# Patient Record
Sex: Male | Born: 1955
Health system: Southern US, Community
[De-identification: ages and names within clinical notes are randomized; demographics above are authoritative.]

## PROBLEM LIST (undated history)

## (undated) DIAGNOSIS — E785 Hyperlipidemia, unspecified: Secondary | ICD-10-CM

## (undated) DIAGNOSIS — R7303 Prediabetes: Secondary | ICD-10-CM

## (undated) DIAGNOSIS — I10 Essential (primary) hypertension: Secondary | ICD-10-CM

## (undated) HISTORY — DX: Prediabetes: R73.03

## (undated) HISTORY — PX: NO PAST SURGERIES: SHX2092

## (undated) HISTORY — DX: Essential (primary) hypertension: I10

## (undated) HISTORY — DX: Hyperlipidemia, unspecified: E78.5

---

## 2005-06-14 ENCOUNTER — Emergency Department (HOSPITAL_COMMUNITY): Admission: EM | Admit: 2005-06-14 | Discharge: 2005-06-14 | Payer: Self-pay | Admitting: Emergency Medicine

## 2010-11-07 ENCOUNTER — Emergency Department (HOSPITAL_COMMUNITY)
Admission: EM | Admit: 2010-11-07 | Discharge: 2010-11-07 | Disposition: A | Discharge status: Home / Self Care | Payer: Self-pay | Attending: Emergency Medicine | Admitting: Emergency Medicine

## 2010-11-07 DIAGNOSIS — R030 Elevated blood-pressure reading, without diagnosis of hypertension: Secondary | ICD-10-CM | POA: Insufficient documentation

## 2010-11-07 DIAGNOSIS — R109 Unspecified abdominal pain: Secondary | ICD-10-CM | POA: Insufficient documentation

## 2012-10-09 ENCOUNTER — Encounter (HOSPITAL_COMMUNITY): Payer: Self-pay | Admitting: Emergency Medicine

## 2012-10-09 ENCOUNTER — Emergency Department (HOSPITAL_COMMUNITY): Payer: Self-pay

## 2012-10-09 ENCOUNTER — Emergency Department (HOSPITAL_COMMUNITY)
Admission: EM | Admit: 2012-10-09 | Discharge: 2012-10-09 | Disposition: A | Discharge status: Home / Self Care | Payer: Self-pay | Attending: Emergency Medicine | Admitting: Emergency Medicine

## 2012-10-09 DIAGNOSIS — R509 Fever, unspecified: Secondary | ICD-10-CM | POA: Insufficient documentation

## 2012-10-09 DIAGNOSIS — R079 Chest pain, unspecified: Secondary | ICD-10-CM | POA: Insufficient documentation

## 2012-10-09 DIAGNOSIS — R109 Unspecified abdominal pain: Secondary | ICD-10-CM | POA: Insufficient documentation

## 2012-10-09 LAB — CBC WITH DIFFERENTIAL/PLATELET
Basophils Absolute: 0.1 10*3/uL (ref 0.0–0.1)
Basophils Relative: 1 % (ref 0–1)
Eosinophils Absolute: 0.1 10*3/uL (ref 0.0–0.7)
Hemoglobin: 13.2 g/dL (ref 13.0–17.0)
Lymphocytes Relative: 25 % (ref 12–46)
MCH: 21.7 pg — ABNORMAL LOW (ref 26.0–34.0)
MCHC: 33.8 g/dL (ref 30.0–36.0)
Monocytes Absolute: 0.4 10*3/uL (ref 0.1–1.0)
Neutrophils Relative %: 68 % (ref 43–77)
Platelets: 236 10*3/uL (ref 150–400)
RDW: 15.9 % — ABNORMAL HIGH (ref 11.5–15.5)

## 2012-10-09 LAB — URINALYSIS, ROUTINE W REFLEX MICROSCOPIC
Bilirubin Urine: NEGATIVE
Glucose, UA: NEGATIVE mg/dL
Ketones, ur: NEGATIVE mg/dL
Nitrite: NEGATIVE
Specific Gravity, Urine: 1.017 (ref 1.005–1.030)
pH: 6 (ref 5.0–8.0)

## 2012-10-09 LAB — COMPREHENSIVE METABOLIC PANEL
ALT: 20 U/L (ref 0–53)
AST: 18 U/L (ref 0–37)
Albumin: 4.2 g/dL (ref 3.5–5.2)
Alkaline Phosphatase: 89 U/L (ref 39–117)
Calcium: 9.3 mg/dL (ref 8.4–10.5)
GFR calc Af Amer: 63 mL/min — ABNORMAL LOW (ref >90)
Potassium: 4.1 mEq/L (ref 3.5–5.1)
Sodium: 136 mEq/L (ref 135–145)
Total Protein: 7.6 g/dL (ref 6.0–8.3)

## 2012-10-09 LAB — TROPONIN I: Troponin I: <0.3 ng/mL (ref <0.30)

## 2012-10-09 MED ORDER — HYDROMORPHONE HCL PF 1 MG/ML IJ SOLN
1.0000 mg | Freq: Once | INTRAMUSCULAR | Status: AC
  Administered 2012-10-09: 1 mg via INTRAVENOUS
  Filled 2012-10-09: qty 1

## 2012-10-09 MED ORDER — TRAMADOL HCL 50 MG PO TABS: 50.0000 mg | ORAL_TABLET | Freq: Four times a day (QID) | ORAL | Status: DC | PRN

## 2012-10-09 MED ORDER — SODIUM CHLORIDE 0.9 % IV BOLUS (SEPSIS)
250.0000 mL | Freq: Once | INTRAVENOUS | Status: AC
  Administered 2012-10-09: 250 mL via INTRAVENOUS

## 2012-10-09 MED ORDER — ONDANSETRON HCL 4 MG/2ML IJ SOLN
4.0000 mg | Freq: Once | INTRAMUSCULAR | Status: AC
  Administered 2012-10-09: 4 mg via INTRAVENOUS
  Filled 2012-10-09: qty 2

## 2012-10-09 MED ORDER — IOHEXOL 300 MG/ML  SOLN
100.0000 mL | Freq: Once | INTRAMUSCULAR | Status: AC | PRN
  Administered 2012-10-09: 100 mL via INTRAVENOUS

## 2012-10-09 MED ORDER — SODIUM CHLORIDE 0.9 % IV SOLN
INTRAVENOUS | Status: DC
  Administered 2012-10-09: 16:00:00 via INTRAVENOUS

## 2012-10-09 MED ORDER — IOHEXOL 300 MG/ML  SOLN
25.0000 mL | INTRAMUSCULAR | Status: DC | PRN
  Administered 2012-10-09: 25 mL via ORAL

## 2012-10-09 NOTE — ED Provider Notes (Signed)
CSN: [id number]     Arrival date & time 10/09/12  1311 History   First MD Initiated Contact with Patient 10/09/12 1332     Chief Complaint  Patient presents with  . Abdominal Pain   (Consider location/radiation/quality/duration/timing/severity/associated sxs/prior Treatment) The history is provided by the patient and the spouse.   57 year old male 1 week history of lower quadrant abdominal pain no nausea vomiting or diarrhea no fever or chills. Appetite and normal. No dysuria has had some intermittent chest pain over the same period of time. Pain does not radiate to the back patient states her worst pain is 8/10. Achy in nature nature.  History reviewed. No pertinent past medical history. History reviewed. No pertinent past surgical history. History reviewed. No pertinent family history. History  Substance Use Topics  . Smoking status: Never Smoker   . Smokeless tobacco: Not on file  . Alcohol Use: Yes     Comment: occ    Review of Systems  Constitutional: Positive for fever.  HENT: Negative for congestion.   Eyes: Negative for redness.  Respiratory: Negative for shortness of breath.   Cardiovascular: Positive for chest pain.  Gastrointestinal: Positive for abdominal pain. Negative for nausea, vomiting and diarrhea.  Genitourinary: Negative for dysuria.  Musculoskeletal: Negative for back pain.  Skin: Negative for rash.  Neurological: Negative for headaches.  Hematological: Does not bruise/bleed easily.  Psychiatric/Behavioral: Negative for confusion.    Allergies  Review of patient's allergies indicates no known allergies.  Home Medications  No current outpatient prescriptions on file. BP 172/92  Pulse 85  Temp(Src) 98 F (36.7 C) (Oral)  Resp 18  Ht 6\' 4"  (1.93 m)  Wt 246 lb (111.585 kg)  BMI 29.96 kg/m2  SpO2 98% Physical Exam  Nursing note and vitals reviewed. Constitutional: He is oriented to person, place, and time. He appears well-developed and  well-nourished. No distress.  HENT:  Head: Normocephalic and atraumatic.  Mouth/Throat: Oropharynx is clear and moist.  Eyes: Conjunctivae and EOM are normal. Pupils are equal, round, and reactive to light.  Neck: Normal range of motion. Neck supple.  Cardiovascular: Normal rate and normal heart sounds.   No murmur heard. Pulmonary/Chest: Effort normal and breath sounds normal.  Abdominal: Soft. Bowel sounds are normal. There is no tenderness.  Musculoskeletal: Normal range of motion. He exhibits no edema.  Lymphadenopathy:    He has no cervical adenopathy.  Neurological: He is alert and oriented to person, place, and time. No cranial nerve deficit. He exhibits normal muscle tone. Coordination normal.  Skin: Skin is warm. No rash noted.    ED Course  Procedures (including critical care time) Labs Review Labs Reviewed  CBC WITH DIFFERENTIAL - Abnormal; Notable for the following:    RBC 6.07 (*)    MCV 64.3 (*)    MCH 21.7 (*)    RDW 15.9 (*)    All other components within normal limits  COMPREHENSIVE METABOLIC PANEL - Abnormal; Notable for the following:    Creatinine, Ser 1.41 (*)    GFR calc non Af Amer 55 (*)    GFR calc Af Amer 63 (*)    All other components within normal limits  URINALYSIS, ROUTINE W REFLEX MICROSCOPIC  LIPASE, BLOOD  TROPONIN I    Date: 10/09/2012  Rate: 69  Rhythm: normal sinus rhythm  QRS Axis: normal  Intervals: normal  ST/T Wave abnormalities: normal  Conduction Disutrbances:none  Narrative Interpretation:   Old EKG Reviewed: none available Results for orders placed during  the hospital encounter of 10/09/12  CBC WITH DIFFERENTIAL      Result Value Range   WBC 8.9  4.0 - 10.5 K/uL   RBC 6.07 (*) 4.22 - 5.81 MIL/uL   Hemoglobin 13.2  13.0 - 17.0 g/dL   HCT 40.9  81.1 - 91.4 %   MCV 64.3 (*) 78.0 - 100.0 fL   MCH 21.7 (*) 26.0 - 34.0 pg   MCHC 33.8  30.0 - 36.0 g/dL   RDW 78.2 (*) 95.6 - 21.3 %   Platelets 236  150 - 400 K/uL    Neutrophils Relative % 68  43 - 77 %   Lymphocytes Relative 25  12 - 46 %   Monocytes Relative 5  3 - 12 %   Eosinophils Relative 1  0 - 5 %   Basophils Relative 1  0 - 1 %   Neutro Abs 6.1  1.7 - 7.7 K/uL   Lymphs Abs 2.2  0.7 - 4.0 K/uL   Monocytes Absolute 0.4  0.1 - 1.0 K/uL   Eosinophils Absolute 0.1  0.0 - 0.7 K/uL   Basophils Absolute 0.1  0.0 - 0.1 K/uL   RBC Morphology ELLIPTOCYTES    COMPREHENSIVE METABOLIC PANEL      Result Value Range   Sodium 136  135 - 145 mEq/L   Potassium 4.1  3.5 - 5.1 mEq/L   Chloride 101  96 - 112 mEq/L   CO2 26  19 - 32 mEq/L   Glucose, Bld 90  70 - 99 mg/dL   BUN 10  6 - 23 mg/dL   Creatinine, Ser 0.86 (*) 0.50 - 1.35 mg/dL   Calcium 9.3  8.4 - 57.8 mg/dL   Total Protein 7.6  6.0 - 8.3 g/dL   Albumin 4.2  3.5 - 5.2 g/dL   AST 18  0 - 37 U/L   ALT 20  0 - 53 U/L   Alkaline Phosphatase 89  39 - 117 U/L   Total Bilirubin 0.4  0.3 - 1.2 mg/dL   GFR calc non Af Amer 55 (*) >90 mL/min   GFR calc Af Amer 63 (*) >90 mL/min  URINALYSIS, ROUTINE W REFLEX MICROSCOPIC      Result Value Range   Color, Urine YELLOW  YELLOW   APPearance CLEAR  CLEAR   Specific Gravity, Urine 1.017  1.005 - 1.030   pH 6.0  5.0 - 8.0   Glucose, UA NEGATIVE  NEGATIVE mg/dL   Hgb urine dipstick NEGATIVE  NEGATIVE   Bilirubin Urine NEGATIVE  NEGATIVE   Ketones, ur NEGATIVE  NEGATIVE mg/dL   Protein, ur NEGATIVE  NEGATIVE mg/dL   Urobilinogen, UA 1.0  0.0 - 1.0 mg/dL   Nitrite NEGATIVE  NEGATIVE   Leukocytes, UA NEGATIVE  NEGATIVE  ] Results for orders placed during the hospital encounter of 10/09/12  CBC WITH DIFFERENTIAL      Result Value Range   WBC 8.9  4.0 - 10.5 K/uL   RBC 6.07 (*) 4.22 - 5.81 MIL/uL   Hemoglobin 13.2  13.0 - 17.0 g/dL   HCT 46.9  62.9 - 52.8 %   MCV 64.3 (*) 78.0 - 100.0 fL   MCH 21.7 (*) 26.0 - 34.0 pg   MCHC 33.8  30.0 - 36.0 g/dL   RDW 41.3 (*) 24.4 - 01.0 %   Platelets 236  150 - 400 K/uL   Neutrophils Relative % 68  43 - 77 %    Lymphocytes Relative 25  12 - 46 %   Monocytes Relative 5  3 - 12 %   Eosinophils Relative 1  0 - 5 %   Basophils Relative 1  0 - 1 %   Neutro Abs 6.1  1.7 - 7.7 K/uL   Lymphs Abs 2.2  0.7 - 4.0 K/uL   Monocytes Absolute 0.4  0.1 - 1.0 K/uL   Eosinophils Absolute 0.1  0.0 - 0.7 K/uL   Basophils Absolute 0.1  0.0 - 0.1 K/uL   RBC Morphology ELLIPTOCYTES    COMPREHENSIVE METABOLIC PANEL      Result Value Range   Sodium 136  135 - 145 mEq/L   Potassium 4.1  3.5 - 5.1 mEq/L   Chloride 101  96 - 112 mEq/L   CO2 26  19 - 32 mEq/L   Glucose, Bld 90  70 - 99 mg/dL   BUN 10  6 - 23 mg/dL   Creatinine, Ser 1.61 (*) 0.50 - 1.35 mg/dL   Calcium 9.3  8.4 - 09.6 mg/dL   Total Protein 7.6  6.0 - 8.3 g/dL   Albumin 4.2  3.5 - 5.2 g/dL   AST 18  0 - 37 U/L   ALT 20  0 - 53 U/L   Alkaline Phosphatase 89  39 - 117 U/L   Total Bilirubin 0.4  0.3 - 1.2 mg/dL   GFR calc non Af Amer 55 (*) >90 mL/min   GFR calc Af Amer 63 (*) >90 mL/min  URINALYSIS, ROUTINE W REFLEX MICROSCOPIC      Result Value Range   Color, Urine YELLOW  YELLOW   APPearance CLEAR  CLEAR   Specific Gravity, Urine 1.017  1.005 - 1.030   pH 6.0  5.0 - 8.0   Glucose, UA NEGATIVE  NEGATIVE mg/dL   Hgb urine dipstick NEGATIVE  NEGATIVE   Bilirubin Urine NEGATIVE  NEGATIVE   Ketones, ur NEGATIVE  NEGATIVE mg/dL   Protein, ur NEGATIVE  NEGATIVE mg/dL   Urobilinogen, UA 1.0  0.0 - 1.0 mg/dL   Nitrite NEGATIVE  NEGATIVE   Leukocytes, UA NEGATIVE  NEGATIVE  LIPASE, BLOOD      Result Value Range   Lipase 33  11 - 59 U/L  TROPONIN I      Result Value Range   Troponin I <0.30  <0.30 ng/mL     Imaging Review Dg Chest 2 View  10/09/2012   *RADIOLOGY REPORT*  Clinical Data: Pain  CHEST - 2 VIEW  Comparison: None.  Findings: There is subtle opacity in the lateral right base.  Lungs elsewhere clear.  Heart size and pulmonary vascularity normal.  No adenopathy.  No pneumothorax.  No bone lesions.  IMPRESSION: Subtle infiltrate lateral  right base.  Lungs otherwise clear.   Original Report Authenticated By: Bretta Bang, M.D.   Ct Abdomen Pelvis W Contrast  10/09/2012   *RADIOLOGY REPORT*  Clinical Data: Lower abdominal pain for the past week.  CT ABDOMEN AND PELVIS WITH CONTRAST  Technique:  Multidetector CT imaging of the abdomen and pelvis was performed following the standard protocol during bolus administration of intravenous contrast.  Contrast: OMNIPAQUE IOHEXOL 300 MG/ML  SOLN  Comparison: No priors.  Findings:  Lung Bases: Unremarkable.  Abdomen/Pelvis:  The appearance of the liver, gallbladder, pancreas, spleen, bilateral adrenal glands and bilateral kidneys is unremarkable.  Normal appendix.  There are a few scattered colonic diverticula noted, without surrounding inflammatory changes to suggest an acute diverticulitis at this time.  No significant  volume of ascites.  No pneumoperitoneum.  No pathologic distension of small bowel.  No definite pathologic lymphadenopathy identified within the abdomen or pelvis.  Mild atherosclerosis throughout the abdominal and pelvic vasculature, without definite aneurysm or dissection.  The prostate gland and urinary bladder are unremarkable in appearance.  Musculoskeletal: There are no aggressive appearing lytic or blastic lesions noted in the visualized portions of the skeleton.  IMPRESSION: 1.  No acute findings in the abdomen or pelvis to account for patient's symptoms. 2.  Specifically, the appendix is normal. 3.  Mild colonic diverticulosis without findings to suggest acute diverticulitis at this time. 4.   Mild atherosclerosis.   Original Report Authenticated By: Trudie Reed, M.D.    MDM  No diagnosis found.   Discuss with radiology. CT cuts up into the lower part of the longest is not show any evidence of infiltrate or nodule opacity on the right lung base. There is an area of atelectasis there. Patient clinically without a leukocytosis CT of the abdomen without findings to  explain the abdominal discomfort. May give patient a trial of Pepcid no change in liver function tests urinalysis is negative. Patient's lipase is still pending along with a troponin.   The patient's lab workup without any significant findings no leukocytosis no liver function test abnormalities. As stated above the questionable opacification on the chest x-ray was cleared through the CT of the abdomen. Patient does have diverticulosis maybe that explains some of the mild discomfort that he is having patient's in no acute distress. Recommend followup with wellness clinic and possible colonoscopy. We'll treat with tramadol in the meantime. As stated patient is nontoxic and in no acute distress. Patient denied dysuria to me.  In addition patient did talk about some intermittent chest pain nothing lasting for a period of time. Has been present for 1 week as well troponins negative EKG without any acute changes. No concerns for an acute cardiac event.  Shelda Jakes, MD 10/09/12 [phone]

## 2012-10-09 NOTE — ED Notes (Signed)
Pt c/o lower abd pain x 1 week; pt sts some dysuria

## 2012-10-09 NOTE — ED Notes (Signed)
Ct aware pt is done drinking constrast.

## 2012-10-09 NOTE — ED Notes (Signed)
Patient resting. Returned from [employer]. Denies pain. Awaiting radiology results.

## 2012-10-09 NOTE — ED Notes (Signed)
Pt ambulatory to bathroom to provide urine sample

## 2012-10-09 NOTE — ED Notes (Signed)
Pt did not answer x 1 

## 2016-03-13 ENCOUNTER — Other Ambulatory Visit: Payer: Self-pay

## 2016-03-13 ENCOUNTER — Ambulatory Visit: Payer: 59 | Attending: Family Medicine | Admitting: Family Medicine

## 2016-03-13 VITALS — BP 157/100 | HR 107 | Temp 98.1°F | Resp 18 | Ht 76.0 in | Wt 261.2 lb

## 2016-03-13 DIAGNOSIS — I10 Essential (primary) hypertension: Secondary | ICD-10-CM | POA: Insufficient documentation

## 2016-03-13 DIAGNOSIS — N529 Male erectile dysfunction, unspecified: Secondary | ICD-10-CM

## 2016-03-13 DIAGNOSIS — Z0000 Encounter for general adult medical examination without abnormal findings: Secondary | ICD-10-CM

## 2016-03-13 HISTORY — DX: Essential (primary) hypertension: I10

## 2016-03-13 LAB — CBC WITH DIFFERENTIAL/PLATELET
BASOS ABS: 70 {cells}/uL (ref 0–200)
Basophils Relative: 1 %
Eosinophils Absolute: 70 cells/uL (ref 15–500)
Eosinophils Relative: 1 %
HEMATOCRIT: 41.7 % (ref 38.5–50.0)
Hemoglobin: 13.6 g/dL (ref 13.2–17.1)
LYMPHS ABS: 1470 {cells}/uL (ref 850–3900)
Lymphocytes Relative: 21 %
MCH: 21 pg — ABNORMAL LOW (ref 27.0–33.0)
MCHC: 32.6 g/dL (ref 32.0–36.0)
MCV: 64.5 fL — ABNORMAL LOW (ref 80.0–100.0)
MONO ABS: 350 {cells}/uL (ref 200–950)
Monocytes Relative: 5 %
NEUTROS PCT: 72 %
Neutro Abs: 5040 cells/uL (ref 1500–7800)
Platelets: 204 10*3/uL (ref 140–400)
RBC: 6.47 MIL/uL — AB (ref 4.20–5.80)
RDW: 17.6 % — ABNORMAL HIGH (ref 11.0–15.0)
WBC: 7 10*3/uL (ref 3.8–10.8)

## 2016-03-13 LAB — BASIC METABOLIC PANEL WITH GFR
BUN: 19 mg/dL (ref 7–25)
CHLORIDE: 105 mmol/L (ref 98–110)
CO2: 25 mmol/L (ref 20–31)
Calcium: 10.3 mg/dL (ref 8.6–10.3)
Creat: 1.6 mg/dL — ABNORMAL HIGH (ref 0.70–1.33)
GFR, Est African American: 54 mL/min — ABNORMAL LOW (ref >=60)
GFR, Est Non African American: 46 mL/min — ABNORMAL LOW (ref >=60)
GLUCOSE: 103 mg/dL — AB (ref 65–99)
POTASSIUM: 4.6 mmol/L (ref 3.5–5.3)
Sodium: 139 mmol/L (ref 135–146)

## 2016-03-13 LAB — HEPATIC FUNCTION PANEL
ALBUMIN: 4.9 g/dL (ref 3.6–5.1)
ALT: 26 U/L (ref 9–46)
AST: 24 U/L (ref 10–35)
Alkaline Phosphatase: 84 U/L (ref 40–115)
BILIRUBIN TOTAL: 0.5 mg/dL (ref 0.2–1.2)
Bilirubin, Direct: 0.1 mg/dL (ref <=0.2)
Indirect Bilirubin: 0.4 mg/dL (ref 0.2–1.2)
Total Protein: 8.1 g/dL (ref 6.1–8.1)

## 2016-03-13 LAB — LIPID PANEL
Cholesterol: 231 mg/dL — ABNORMAL HIGH (ref <200)
HDL: 44 mg/dL (ref >40)
LDL Cholesterol: 162 mg/dL — ABNORMAL HIGH (ref <100)
Total CHOL/HDL Ratio: 5.3 Ratio — ABNORMAL HIGH (ref <5.0)
Triglycerides: 127 mg/dL (ref <150)
VLDL: 25 mg/dL (ref <30)

## 2016-03-13 LAB — PSA: PSA: 1.2 ng/mL (ref <=4.0)

## 2016-03-13 LAB — TSH: TSH: 0.76 mIU/L (ref 0.40–4.50)

## 2016-03-13 MED ORDER — AMLODIPINE BESYLATE 5 MG PO TABS: 5.0000 mg | ORAL_TABLET | Freq: Every day | ORAL | 2 refills | Status: DC

## 2016-03-13 MED ORDER — CLONIDINE HCL 0.2 MG PO TABS
0.2000 mg | ORAL_TABLET | Freq: Once | ORAL | Status: AC
  Administered 2016-03-13: 0.2 mg via ORAL

## 2016-03-13 MED ORDER — HYDROCHLOROTHIAZIDE 25 MG PO TABS: 25.0000 mg | ORAL_TABLET | Freq: Every day | ORAL | 3 refills | Status: DC

## 2016-03-13 MED ORDER — TADALAFIL 20 MG PO TABS: 10.0000 mg | ORAL_TABLET | Freq: Every day | ORAL | 2 refills | Status: DC | PRN

## 2016-03-13 NOTE — Patient Instructions (Addendum)
Come back in 1 week for BP check with clinical pharmacist. Start taking BP at least 3 times a week while at rest. Bring BP log to next visit.   Hypertension Hypertension is another name for high blood pressure. High blood pressure forces your heart to work harder to pump blood. A blood pressure reading has two numbers, which includes a higher number over a lower number (example: 110/72). Follow these instructions at home:  Have your blood pressure rechecked by your doctor.  Only take medicine as told by your doctor. Follow the directions carefully. The medicine does not work as well if you skip doses. Skipping doses also puts you at risk for problems.  Do not smoke.  Monitor your blood pressure at home as told by your doctor. Contact a doctor if:  You think you are having a reaction to the medicine you are taking.  You have repeat headaches or feel dizzy.  You have puffiness (swelling) in your ankles.  You have trouble with your vision. Get help right away if:  You get a very bad headache and are confused.  You feel weak, numb, or faint.  You get chest or belly (abdominal) pain.  You throw up (vomit).  You cannot breathe very well. This information is not intended to replace advice given to you by your health care provider. Make sure you discuss any questions you have with your health care provider. Document Released: 07/10/2007 Document Revised: 06/29/2015 Document Reviewed: 11/13/2012 Elsevier Interactive Patient Education  2017 [employer]  Erectile Dysfunction Erectile dysfunction is the inability to get or sustain a good enough erection to have sexual intercourse. Erectile dysfunction may involve:  Inability to get an erection.  Lack of enough hardness to allow penetration.  Loss of the erection before sex is finished.  Premature ejaculation. CAUSES  Certain drugs, such as:  Pain relievers.  Antihistamines.  Antidepressants.  Blood pressure  medicines.  Water pills (diuretics).  Ulcer medicines.  Muscle relaxants.  Illegal drugs.  Excessive drinking.  Psychological causes, such as:  Anxiety.  Depression.  Sadness.  Exhaustion.  Performance fear.  Stress.  Physical causes, such as:  Artery problems. This may include diabetes, smoking, liver disease, or atherosclerosis.  High blood pressure.  Hormonal problems, such as low testosterone.  Obesity.  Nerve problems. This may include back or pelvic injuries, diabetes mellitus, multiple sclerosis, or Parkinson disease. SYMPTOMS  Inability to get an erection.  Lack of enough hardness to allow penetration.  Loss of the erection before sex is finished.  Premature ejaculation.  Normal erections at some times, but with frequent unsatisfactory episodes.  Orgasms that are not satisfactory in sensation or frequency.  Low sexual satisfaction in either partner because of erection problems.  A curved penis occurring with erection. The curve may cause pain or may be too curved to allow for intercourse.  Never having nighttime erections. DIAGNOSIS Your caregiver can often diagnose this condition by:  Performing a physical exam to find other diseases or specific problems with the penis.  Asking you detailed questions about the problem.  Performing blood tests to check for diabetes mellitus or to measure hormone levels.  Performing urine tests to find other underlying health conditions.  Performing an ultrasound exam to check for scarring.  Performing a test to check blood flow to the penis.  Doing a sleep study at home to measure nighttime erections. TREATMENT   You may be prescribed medicines by mouth.  You may be given medicine injections into  the penis.  You may be prescribed a vacuum pump with a ring.  Penile implant surgery may be performed. You may receive:  An inflatable implant.  A semirigid implant.  Blood vessel surgery may be  performed. HOME CARE INSTRUCTIONS  If you are prescribed oral medicine, you should take the medicine as prescribed. Do not increase the dosage without first discussing it with your physician.  If you are using self-injections, be careful to avoid any veins that are on the surface of the penis. Apply pressure to the injection site for 5 minutes.  If you are using a vacuum pump, make sure you have read the instructions before using it. Discuss any questions with your physician before taking the pump home. SEEK MEDICAL CARE IF:  You experience pain that is not responsive to the pain medicine you have been prescribed.  You experience nausea or vomiting. SEEK IMMEDIATE MEDICAL CARE IF:   When taking oral or injectable medications, you experience an erection that lasts longer than 4 hours. If your physician is unavailable, go to the nearest emergency room for evaluation. An erection that lasts much longer than 4 hours can result in permanent damage to your penis.  You have pain that is severe.  You develop redness, severe pain, or severe swelling of your penis.  You have redness spreading up into your groin or lower abdomen.  You are unable to pass your urine. This information is not intended to replace advice given to you by your health care provider. Make sure you discuss any questions you have with your health care provider. Document Released: 01/19/2000 Document Revised: 09/23/2012 Document Reviewed: 06/25/2012 Elsevier Interactive Patient Education  2017 [employer] Inc.   Tadalafil tablets (Cialis) What is this medicine? TADALAFIL (tah DA la fil) is used to treat erection problems in men. It is also used for enlargement of the prostate gland in men, a condition called benign prostatic hyperplasia or BPH. This medicine improves urine flow and reduces BPH symptoms. This medicine can also treat both erection problems and BPH when they occur together. This medicine may be used for other  purposes; ask your health care provider or pharmacist if you have questions. COMMON BRAND NAME(S): Cialis What should I tell my health care provider before I take this medicine? They need to know if you have any of these conditions: -bleeding disorders -eye or vision problems, including a rare inherited eye disease called retinitis pigmentosa -anatomical deformation of the penis, Peyronie's disease, or history of priapism (painful and prolonged erection) -heart disease, angina, a history of heart attack, irregular heart beats, or other heart problems -high or low blood pressure -history of blood diseases, like sickle cell anemia or leukemia -history of stomach bleeding -kidney disease -liver disease -stroke -an unusual or allergic reaction to tadalafil, other medicines, foods, dyes, or preservatives -pregnant or trying to get pregnant -breast-feeding How should I use this medicine? Take this medicine by mouth with a glass of water. Follow the directions on the prescription label. You may take this medicine with or without meals. When this medicine is used for erection problems, your doctor may prescribe it to be taken once daily or as needed. If you are taking the medicine as needed, you may be able to have sexual activity 30 minutes after taking it and for up to 36 hours after taking it. Whether you are taking the medicine as needed or once daily, you should not take more than one dose per day. If you are taking  this medicine for symptoms of benign prostatic hyperplasia (BPH) or to treat both BPH and an erection problem, take the dose once daily at about the same time each day. Do not take your medicine more often than directed. Talk to your pediatrician regarding the use of this medicine in children. Special care may be needed. Overdosage: If you think you have taken too much of this medicine contact a poison control center or emergency room at once. NOTE: This medicine is only for you. Do not  share this medicine with others. What if I miss a dose? If you are taking this medicine as needed for erection problems, this does not apply. If you miss a dose while taking this medicine once daily for an erection problem, benign prostatic hyperplasia, or both, take it as soon as you remember, but do not take more than one dose per day. What may interact with this medicine? Do not take this medicine with any of the following medications: -nitrates like amyl nitrite, isosorbide dinitrate, isosorbide mononitrate, nitroglycerin -other medicines for erectile dysfunction like avanafil, sildenafil, vardenafil -other tadalafil products (Adcirca) -riociguat This medicine may also interact with the following medications: -certain drugs for high blood pressure -certain drugs for the treatment of HIV infection or AIDS -certain drugs used for fungal or yeast infections, like fluconazole, itraconazole, ketoconazole, and voriconazole -certain drugs used for seizures like carbamazepine, phenytoin, and phenobarbital -grapefruit juice -macrolide antibiotics like clarithromycin, erythromycin, troleandomycin -medicines for prostate problems -rifabutin, rifampin or rifapentine This list may not describe all possible interactions. Give your health care provider a list of all the medicines, herbs, non-prescription drugs, or dietary supplements you use. Also tell them if you smoke, drink alcohol, or use illegal drugs. Some items may interact with your medicine. What should I watch for while using this medicine? If you notice any changes in your vision while taking this drug, call your doctor or health care professional as soon as possible. Stop using this medicine and call your health care provider right away if you have a loss of sight in one or both eyes. Contact your doctor or health care professional right away if the erection lasts longer than 4 hours or if it becomes painful. This may be a sign of serious  problem and must be treated right away to prevent permanent damage. If you experience symptoms of nausea, dizziness, chest pain or arm pain upon initiation of sexual activity after taking this medicine, you should refrain from further activity and call your doctor or health care professional as soon as possible. Do not drink alcohol to excess (examples, 5 glasses of wine or 5 shots of whiskey) when taking this medicine. When taken in excess, alcohol can increase your chances of getting a headache or getting dizzy, increasing your heart rate or lowering your blood pressure. Using this medicine does not protect you or your partner against HIV infection (the virus that causes AIDS) or other sexually transmitted diseases. What side effects may I notice from receiving this medicine? Side effects that you should report to your doctor or health care professional as soon as possible: -allergic reactions like skin rash, itching or hives, swelling of the face, lips, or tongue -breathing problems -changes in hearing -changes in vision -chest pain -fast, irregular heartbeat -prolonged or painful erection -seizures Side effects that usually do not require medical attention (report to your doctor or health care professional if they continue or are bothersome): -back pain -dizziness -flushing -headache -indigestion -muscle aches -nausea -stuffy or  runny nose This list may not describe all possible side effects. Call your doctor for medical advice about side effects. You may report side effects to FDA at 1-800-FDA-1088. Where should I keep my medicine? Keep out of the reach of children. Store at room temperature between 15 and 30 degrees C (59 and 86 degrees F). Throw away any unused medicine after the expiration date. NOTE: This sheet is a summary. It may not cover all possible information. If you have questions about this medicine, talk to your doctor, pharmacist, or health care provider.  2017  Elsevier/Gold Standard (2013-06-11 13:15:49)

## 2016-03-13 NOTE — Progress Notes (Signed)
Subjective:   Patient ID: Alexander Hunt, male    DOB: Mar 09, 1955, 61 y.o.   MRN: [id number]  Chief Complaint  Patient presents with  . Establish Care   HPI Alexander Hunt 61 y.o. male presents for annual physical examination. His blood pressure was found to be elevated this visit. He denies any hypertension. He denies any swelling of the bilateral lower extremities. He does report occasional chest pain with heavy lifting and occasional shortness of breath walking stairs. He denies smoking. He also has complaints of difficulty being able to get an erection. He reports history of erectile dysfunction for the last 3. He is also requesting colonoscopy referral. He denies any blood in stools, pencil thin stools, or change in bowel habits. Denies symptoms of all other pertinent systems.   Past Medical History:  Diagnosis Date  . Hypertension 03/13/2016    No past surgical history on file.  Family History  Problem Relation Age of Onset  . Diabetes Maternal Grandmother   . Hypertension Maternal Grandmother     Social History   Social History  . Marital status: Single    Spouse name: N/A  . Number of children: N/A  . Years of education: N/A   Occupational History  . Not on file.   Social History Main Topics  . Smoking status: Never Smoker  . Smokeless tobacco: Not on file  . Alcohol use Yes     Comment: occ  . Drug use: No  . Sexual activity: Not on file   Other Topics Concern  . Not on file   Social History Narrative  . No narrative on file    Outpatient Medications Prior to Visit  Medication Sig Dispense Refill  . traMADol (ULTRAM) 50 MG tablet Take 1 tablet (50 mg total) by mouth every 6 (six) hours as needed for pain. (Patient not taking: Reported on 03/13/2016) 15 tablet 0   No facility-administered medications prior to visit.     No Known Allergies  Review of Systems  Constitutional: Negative.   HENT: Negative.   Eyes: Negative.   Respiratory: Positive for  shortness of breath.   Cardiovascular: Positive for chest pain (with heavy lifting).  Gastrointestinal: Negative.   Genitourinary:       Difficulty getting an erection.   Musculoskeletal: Negative.   Skin: Negative.   Neurological: Negative.   Psychiatric/Behavioral: Negative.       Objective:    Physical Exam  Constitutional: He is oriented to person, place, and time. He appears well-developed and well-nourished.  HENT:  Head: Normocephalic and atraumatic.  Right Ear: External ear normal.  Left Ear: External ear normal.  Nose: Nose normal.  Mouth/Throat: Oropharynx is clear and moist.  Eyes: Conjunctivae and EOM are normal. Pupils are equal, round, and reactive to light.  Neck: Normal range of motion. Neck supple.  Cardiovascular: Normal rate, regular rhythm, normal heart sounds and intact distal pulses.   Pulmonary/Chest: Effort normal and breath sounds normal.  Abdominal: Soft. Bowel sounds are normal.  Musculoskeletal: Normal range of motion.  Neurological: He is alert and oriented to person, place, and time. He has normal reflexes.  Skin: Skin is warm and dry.  Psychiatric: He has a normal mood and affect. His behavior is normal. Thought content normal.  Nursing note and vitals reviewed.   BP (!) 157/100   Pulse (!) 107   Temp 98.1 F (36.7 C) (Oral)   Resp 18   Ht 6\' 4"  (1.93 m)   Wt 261 lb  3.2 oz (118.5 kg)   SpO2 96%   BMI 31.79 kg/m  Wt Readings from Last 3 Encounters:  03/13/16 261 lb 3.2 oz (118.5 kg)  10/09/12 246 lb (111.6 kg)     Lab Results  Component Value Date   TSH 0.76 03/13/2016   Lab Results  Component Value Date   WBC 7.0 03/13/2016   HGB 13.6 03/13/2016   HCT 41.7 03/13/2016   MCV 64.5 (L) 03/13/2016   PLT 204 03/13/2016   Lab Results  Component Value Date   NA 139 03/13/2016   K 4.6 03/13/2016   CO2 25 03/13/2016   GLUCOSE 103 (H) 03/13/2016   BUN 19 03/13/2016   CREATININE 1.60 (H) 03/13/2016   BILITOT 0.5 03/13/2016    ALKPHOS 84 03/13/2016   AST 24 03/13/2016   ALT 26 03/13/2016   PROT 8.1 03/13/2016   ALBUMIN 4.9 03/13/2016   ALBUMIN 4.9 03/13/2016   CALCIUM 10.3 03/13/2016   Lab Results  Component Value Date   CHOL 231 (H) 03/13/2016   Lab Results  Component Value Date   HDL 44 03/13/2016   Lab Results  Component Value Date   LDLCALC 162 (H) 03/13/2016   Lab Results  Component Value Date   TRIG 127 03/13/2016   Lab Results  Component Value Date   CHOLHDL 5.3 (H) 03/13/2016   Lab Results  Component Value Date   HGBA1C 6.0 (H) 03/13/2016       Assessment & Plan:   Problem List Items Addressed This Visit    None    Visit Diagnoses    Annual physical exam    -  Primary   Relevant Orders   BASIC METABOLIC PANEL WITH GFR (Completed)   CBC with Differential (Completed)   TSH (Completed)   Vitamin D, 25-hydroxy (Completed)   Lipid Panel (Completed)   Hepatic Function Panel (Completed)   Hemoglobin A1c (Completed)   PSA (Completed)   Essential hypertension       -Elevated BP during office. Clonidine given per protocol.    Relevant Medications   amLODipine (NORVASC) 5 MG tablet   hydrochlorothiazide (HYDRODIURIL) 25 MG tablet   tadalafil (CIALIS) 20 MG tablet   cloNIDine (CATAPRES) tablet 0.2 mg (Completed)   Other Relevant Orders   EKG 12-Lead   Erectile dysfunction, unspecified erectile dysfunction type       Relevant Medications   tadalafil (CIALIS) 20 MG tablet   Other Relevant Orders   Testosterone Total,Free,Bio, Males (Completed)   Healthcare maintenance       Relevant Orders   Ambulatory referral to Gastroenterology      Meds ordered this encounter  Medications  . amLODipine (NORVASC) 5 MG tablet    Sig: Take 1 tablet (5 mg total) by mouth daily.    Dispense:  30 tablet    Refill:  2    Order Specific Question:   Supervising Provider    Answer:   Quentin Angst [id number]  . hydrochlorothiazide (HYDRODIURIL) 25 MG tablet    Sig: Take 1 tablet (25  mg total) by mouth daily.    Dispense:  90 tablet    Refill:  3    Order Specific Question:   Supervising Provider    Answer:   Quentin Angst [id number]  . tadalafil (CIALIS) 20 MG tablet    Sig: Take 0.5-1 tablets (10-20 mg total) by mouth daily as needed for erectile dysfunction.    Dispense:  5 tablet    Refill:  2    Order Specific Question:   Supervising Provider    Answer:   Quentin AngstJEGEDE, OLUGBEMIGA E [id number]]  . cloNIDine (CATAPRES) tablet 0.2 mg    Follow up: Return in about 1 week (around 03/20/2016) for BP check with clinical pharmacist .   Arrie SenateMandesia Keylon Labelle, FNP

## 2016-03-13 NOTE — Progress Notes (Signed)
Patient is here for physical  Patient has eaten today  Patient has not taking any meds today

## 2016-03-14 LAB — TESTOSTERONE TOTAL,FREE,BIO, MALES
Albumin: 4.9 g/dL (ref 3.6–5.1)
Sex Hormone Binding: 23 nmol/L (ref 22–77)
TESTOSTERONE BIOAVAILABLE: 92.8 ng/dL — AB (ref 110.0–575.0)
TESTOSTERONE: 260 ng/dL (ref 250–827)
Testosterone, Free: 41.6 pg/mL — ABNORMAL LOW (ref 46.0–224.0)

## 2016-03-14 LAB — HEMOGLOBIN A1C
Hgb A1c MFr Bld: 6 % — ABNORMAL HIGH (ref <5.7)
Mean Plasma Glucose: 126 mg/dL

## 2016-03-14 LAB — VITAMIN D 25 HYDROXY (VIT D DEFICIENCY, FRACTURES): VIT D 25 HYDROXY: 11 ng/mL — AB (ref 30–100)

## 2016-03-16 ENCOUNTER — Encounter: Payer: Self-pay | Admitting: Family Medicine

## 2016-03-20 ENCOUNTER — Ambulatory Visit: Payer: 59 | Attending: Family Medicine | Admitting: Pharmacist

## 2016-03-20 VITALS — BP 159/90 | HR 79

## 2016-03-20 DIAGNOSIS — I10 Essential (primary) hypertension: Secondary | ICD-10-CM

## 2016-03-20 MED ORDER — AMLODIPINE BESYLATE 10 MG PO TABS: 10.0000 mg | ORAL_TABLET | Freq: Every day | ORAL | 3 refills | Status: DC

## 2016-03-20 NOTE — Progress Notes (Signed)
   S:    Patient arrives in good spirits.    Presents to the clinic for hypertension evaluation. Patient was referred on 03/13/16.  Patient was last seen by Primary Care Provider on 03/13/16.   Patient reports adherence with medications.  Current BP Medications include:  Amlodipine 5 mg daily, hydrochlorothiazide 25 mg daily.  Antihypertensives tried in the past include: none - newly diagnosed   O:   Last 3 Office BP readings: BP Readings from Last 3 Encounters:  03/13/16 (!) 157/100  10/09/12 157/87    BMET    Component Value Date/Time   NA 139 03/13/2016 1105   K 4.6 03/13/2016 1105   CL 105 03/13/2016 1105   CO2 25 03/13/2016 1105   GLUCOSE 103 (H) 03/13/2016 1105   BUN 19 03/13/2016 1105   CREATININE 1.60 (H) 03/13/2016 1105   CALCIUM 10.3 03/13/2016 1105   GFRNONAA 46 (L) 03/13/2016 1105   GFRAA 54 (L) 03/13/2016 1105    A/P: Hypertension newly diagnosed currently UNcontrolled on current medications. Continue hydrochlorothiazide 25 mg daily and increase amlodipine to 10 mg daily. Goal blood pressure is <130/80. Will plan for BMET at next visit to assess hydrochlorothiazide impact on electrolytes and to see if kidney function has improved any in case additional agents are needed.  Results reviewed and written information provided.   Total time in face-to-face counseling 20 minutes.   F/U Clinic Visit with me in 1-2 weeks.  Patient seen with Mable FillAmy Dorszynski, PharmD Candidate

## 2016-03-20 NOTE — Patient Instructions (Addendum)
Thanks for coming to see us!  Continue hydrochlorothiazide 25 mg daily.  Increase amlodipine to 10 mg daily. You can take 2 of your 5 mg tablets to use them up but I will send a prescription for the 10 mg tablets to the pharmacy  Come back in 1-2 weeks for a blood pressure check with me  DASH Eating Plan DASH stands for "Dietary Approaches to Stop Hypertension." The DASH eating plan is a healthy eating plan that has been shown to reduce high blood pressure (hypertension). Additional health benefits may include reducing the risk of type 2 diabetes mellitus, heart disease, and stroke. The DASH eating plan may also help with weight loss. What do I need to know about the DASH eating plan? For the DASH eating plan, you will follow these general guidelines:  Choose foods with less than 150 milligrams of sodium per serving (as listed on the food label).  Use salt-free seasonings or herbs instead of table salt or sea salt.  Check with your health care provider or pharmacist before using salt substitutes.  Eat lower-sodium products. These are often labeled as "low-sodium" or "no salt added."  Eat fresh foods. Avoid eating a lot of canned foods.  Eat more vegetables, fruits, and low-fat dairy products.  Choose whole grains. Look for the word "whole" as the first word in the ingredient list.  Choose fish and skinless chicken or [place] more often than red meat. Limit fish, poultry, and meat to 6 oz (170 g) each day.  Limit sweets, desserts, sugars, and sugary drinks.  Choose heart-healthy fats.  Eat more home-cooked food and less restaurant, buffet, and fast food.  Limit fried foods.  Do not fry foods. Cook foods using methods such as baking, boiling, grilling, and broiling instead.  When eating at a restaurant, ask that your food be prepared with less salt, or no salt if possible. What foods can I eat? Seek help from a dietitian for individual calorie needs. Grains  Whole grain or  whole wheat bread. Brown rice. Whole grain or whole wheat pasta. Quinoa, bulgur, and whole grain cereals. Low-sodium cereals. Corn or whole wheat flour tortillas. Whole grain cornbread. Whole grain crackers. Low-sodium crackers. Vegetables  Fresh or frozen vegetables (raw, steamed, roasted, or grilled). Low-sodium or reduced-sodium tomato and vegetable juices. Low-sodium or reduced-sodium tomato sauce and paste. Low-sodium or reduced-sodium canned vegetables. Fruits  All fresh, canned (in natural juice), or frozen fruits. Meat and Other Protein Products  Ground beef (85% or leaner), grass-fed beef, or beef trimmed of fat. Skinless chicken or [place]. Ground chicken or [place]. Pork trimmed of fat. All fish and seafood. Eggs. Dried beans, peas, or lentils. Unsalted nuts and seeds. Unsalted canned beans. Dairy  Low-fat dairy products, such as skim or 1% milk, 2% or reduced-fat cheeses, low-fat ricotta or cottage cheese, or plain low-fat yogurt. Low-sodium or reduced-sodium cheeses. Fats and Oils  Tub margarines without trans fats. Light or reduced-fat mayonnaise and salad dressings (reduced sodium). Avocado. Safflower, olive, or canola oils. Natural peanut or almond butter. Other  Unsalted popcorn and pretzels. The items listed above may not be a complete list of recommended foods or beverages. Contact your dietitian for more options.  What foods are not recommended? Grains  White bread. White pasta. White rice. Refined cornbread. Bagels and croissants. Crackers that contain trans fat. Vegetables  Creamed or fried vegetables. Vegetables in a cheese sauce. Regular canned vegetables. Regular canned tomato sauce and paste. Regular tomato and vegetable juices. Fruits  Canned fruit in light or heavy syrup. Fruit juice. Meat and Other Protein Products  Fatty cuts of meat. Ribs, chicken wings, bacon, sausage, bologna, salami, chitterlings, fatback, hot dogs, bratwurst, and packaged luncheon meats. Salted  nuts and seeds. Canned beans with salt. Dairy  Whole or 2% milk, cream, half-and-half, and cream cheese. Whole-fat or sweetened yogurt. Full-fat cheeses or blue cheese. Nondairy creamers and whipped toppings. Processed cheese, cheese spreads, or cheese curds. Condiments  Onion and garlic salt, seasoned salt, table salt, and sea salt. Canned and packaged gravies. Worcestershire sauce. Tartar sauce. Barbecue sauce. Teriyaki sauce. Soy sauce, including reduced sodium. Steak sauce. Fish sauce. Oyster sauce. Cocktail sauce. Horseradish. Ketchup and mustard. Meat flavorings and tenderizers. Bouillon cubes. Hot sauce. Tabasco sauce. Marinades. Taco seasonings. Relishes. Fats and Oils  Butter, stick margarine, lard, shortening, ghee, and bacon fat. Coconut, palm kernel, or palm oils. Regular salad dressings. Other  Pickles and olives. Salted popcorn and pretzels. The items listed above may not be a complete list of foods and beverages to avoid. Contact your dietitian for more information.  Where can I find more information? National Heart, Lung, and Blood Institute: CableProm[employer].it This information is not intended to replace advice given to you by your health care provider. Make sure you discuss any questions you have with your health care provider. Document Released: 01/10/2011 Document Revised: 06/29/2015 Document Reviewed: 11/25/2012 Elsevier Interactive Patient Education  2017 [employer].

## 2016-03-22 ENCOUNTER — Telehealth: Payer: Self-pay

## 2016-03-22 ENCOUNTER — Other Ambulatory Visit: Payer: Self-pay | Admitting: Family Medicine

## 2016-03-22 DIAGNOSIS — I10 Essential (primary) hypertension: Secondary | ICD-10-CM

## 2016-03-22 DIAGNOSIS — R7303 Prediabetes: Secondary | ICD-10-CM

## 2016-03-22 DIAGNOSIS — E559 Vitamin D deficiency, unspecified: Secondary | ICD-10-CM

## 2016-03-22 DIAGNOSIS — E782 Mixed hyperlipidemia: Secondary | ICD-10-CM

## 2016-03-22 MED ORDER — VITAMIN D (ERGOCALCIFEROL) 1.25 MG (50000 UNIT) PO CAPS: 50000.0000 [IU] | ORAL_CAPSULE | ORAL | 0 refills | Status: AC

## 2016-03-22 MED ORDER — ATORVASTATIN CALCIUM 20 MG PO TABS: 20.0000 mg | ORAL_TABLET | Freq: Every day | ORAL | 2 refills | Status: DC

## 2016-03-22 MED ORDER — ASPIRIN EC 81 MG PO TBEC: 81.0000 mg | DELAYED_RELEASE_TABLET | Freq: Every day | ORAL | 2 refills | Status: DC

## 2016-03-22 NOTE — Telephone Encounter (Signed)
CMA call to go over lab results  Patient did not answer but CMA left a VM stating the reason of the call and to cal me back

## 2016-03-22 NOTE — Telephone Encounter (Signed)
-----   Message from Lizbeth BarkMandesia R Hairston, FNP sent at 03/22/2016  1:16 PM EST ----- -HgbA1c used to screen for diabetes is 6.0 which indicates pre-diabetes. Avoid eating large amounts of starches, white bread, rice, and sugar. Start exercising 3 to 5 days a week for 30 minutes. Will need to schedule recheck in 3 months.  -Vitamin D level was low. Vitamin D helps to keep bones strong. You were prescribed ergocalciferol (capsules) to increase your vitamin-d level. Once finished start taking OTC vitamin d supplement with 800 international units (IU) of vitamin-d per day.  -Labs that evaluate kidney function were elevated.Continue to take your medications for blood pressure. Will recheck in 3 months.  -Lipid levels were elevated. This can increase your risk of heart disease. You have been prescribed atorvastatin and aspirin to lower your risk. -PSA which screen for prostate problem was normal.  -Testerone levels were low. A symptom of low testosterone can cause decline in sexual function muscle mass. You have the option to start testosterone therapy to increase your levels. Risk of testosterone therapy may include increased risk for prostate cancer, heart disease, and elevated red blood cell count.  - Liver function normal  -Thyroid function normal

## 2016-03-25 NOTE — Telephone Encounter (Signed)
-----   Message from Mandesia R Hairston, FNP sent at 03/22/2016  1:16 PM EST ----- -HgbA1c used to screen for diabetes is 6.0 which indicates pre-diabetes. Avoid eating large amounts of starches, white bread, rice, and sugar. Start exercising 3 to 5 days a week for 30 minutes. Will need to schedule recheck in 3 months.  -Vitamin D level was low. Vitamin D helps to keep bones strong. You were prescribed ergocalciferol (capsules) to increase your vitamin-d level. Once finished start taking OTC vitamin d supplement with 800 international units (IU) of vitamin-d per day.  -Labs that evaluate kidney function were elevated.Continue to take your medications for blood pressure. Will recheck in 3 months.  -Lipid levels were elevated. This can increase your risk of heart disease. You have been prescribed atorvastatin and aspirin to lower your risk. -PSA which screen for prostate problem was normal.  -Testerone levels were low. A symptom of low testosterone can cause decline in sexual function muscle mass. You have the option to start testosterone therapy to increase your levels. Risk of testosterone therapy may include increased risk for prostate cancer, heart disease, and elevated red blood cell count.  - Liver function normal  -Thyroid function normal 

## 2016-03-25 NOTE — Telephone Encounter (Signed)
CMA call second attempt to go over lab results  Patient did not answer  VM full to leave a message

## 2016-03-27 ENCOUNTER — Encounter: Payer: Self-pay | Admitting: Internal Medicine

## 2016-03-27 ENCOUNTER — Ambulatory Visit: Payer: 59 | Attending: Family Medicine | Admitting: Pharmacist

## 2016-03-27 VITALS — BP 145/98 | HR 73

## 2016-03-27 DIAGNOSIS — Z79899 Other long term (current) drug therapy: Secondary | ICD-10-CM | POA: Diagnosis not present

## 2016-03-27 DIAGNOSIS — Z0000 Encounter for general adult medical examination without abnormal findings: Secondary | ICD-10-CM

## 2016-03-27 DIAGNOSIS — I10 Essential (primary) hypertension: Secondary | ICD-10-CM | POA: Insufficient documentation

## 2016-03-27 NOTE — Progress Notes (Signed)
   S:    Patient arrives in good spirits.  Presents to the clinic for hypertension evaluation. Patient was referred on 03/13/16.  Patient was last seen by Primary Care Provider on 03/13/16.   Patient reports adherence with medications.  Current BP Medications include:  Amlodipine 10 mg daily, hydrochlorothiazide 25 mg daily.  Antihypertensives tried in the past include: none - newly diagnosed  Patient reports diet high in salt (processed foods and fast foods).   O:   Last 3 Office BP readings: BP Readings from Last 3 Encounters:  03/27/16 (!) 145/98  03/20/16 (!) 159/90  03/13/16 (!) 157/100    BMET    Component Value Date/Time   NA 139 03/13/2016 1105   K 4.6 03/13/2016 1105   CL 105 03/13/2016 1105   CO2 25 03/13/2016 1105   GLUCOSE 103 (H) 03/13/2016 1105   BUN 19 03/13/2016 1105   CREATININE 1.60 (H) 03/13/2016 1105   CALCIUM 10.3 03/13/2016 1105   GFRNONAA 46 (L) 03/13/2016 1105   GFRAA 54 (L) 03/13/2016 1105    A/P: Hypertension newly diagnosed currently UNcontrolled on current medications but does endorse added salt intake. Dose increase in amlodipine did help to bring his blood pressure down. Continue hydrochlorothiazide 25 mg daily and increase amlodipine to 10 mg daily. Goal blood pressure is <130/80. Discussed adding ACEI/ARB with patient but patient chose to try to cut out a lot of salt from his diet to avoid additional medication. Understands that if this is not successful, will like need additional medication to get him to goal. Will not get BMET at this visit and will wait to see if ACEi/ARB is needed at next visit. Provided education on the DASH diet.  Patient requested referral to GI for colonoscopy. Referral placed per protocol.  Results reviewed and written information provided.   Total time in face-to-face counseling 20 minutes.   F/U Clinic Visit with me in 1-2 weeks.  Patient seen with Mable FillAmy Dorszynski, PharmD Candidate

## 2016-03-27 NOTE — Patient Instructions (Addendum)
Thanks for coming to see us!  If you really work on cutting out the salt, it should really help with lowering your blood pressure. Consider salt substitutes (Mrs. Sharilyn SitesDash) in moderation.  Come back in 2 weeks  DASH Eating Plan DASH stands for "Dietary Approaches to Stop Hypertension." The DASH eating plan is a healthy eating plan that has been shown to reduce high blood pressure (hypertension). Additional health benefits may include reducing the risk of type 2 diabetes mellitus, heart disease, and stroke. The DASH eating plan may also help with weight loss. What do I need to know about the DASH eating plan? For the DASH eating plan, you will follow these general guidelines:  Choose foods with less than 150 milligrams of sodium per serving (as listed on the food label).  Use salt-free seasonings or herbs instead of table salt or sea salt.  Check with your health care provider or pharmacist before using salt substitutes.  Eat lower-sodium products. These are often labeled as "low-sodium" or "no salt added."  Eat fresh foods. Avoid eating a lot of canned foods.  Eat more vegetables, fruits, and low-fat dairy products.  Choose whole grains. Look for the word "whole" as the first word in the ingredient list.  Choose fish and skinless chicken or [place] more often than red meat. Limit fish, poultry, and meat to 6 oz (170 g) each day.  Limit sweets, desserts, sugars, and sugary drinks.  Choose heart-healthy fats.  Eat more home-cooked food and less restaurant, buffet, and fast food.  Limit fried foods.  Do not fry foods. Cook foods using methods such as baking, boiling, grilling, and broiling instead.  When eating at a restaurant, ask that your food be prepared with less salt, or no salt if possible. What foods can I eat? Seek help from a dietitian for individual calorie needs. Grains  Whole grain or whole wheat bread. Brown rice. Whole grain or whole wheat pasta. Quinoa, bulgur, and  whole grain cereals. Low-sodium cereals. Corn or whole wheat flour tortillas. Whole grain cornbread. Whole grain crackers. Low-sodium crackers. Vegetables  Fresh or frozen vegetables (raw, steamed, roasted, or grilled). Low-sodium or reduced-sodium tomato and vegetable juices. Low-sodium or reduced-sodium tomato sauce and paste. Low-sodium or reduced-sodium canned vegetables. Fruits  All fresh, canned (in natural juice), or frozen fruits. Meat and Other Protein Products  Ground beef (85% or leaner), grass-fed beef, or beef trimmed of fat. Skinless chicken or [place]key. Ground chicken or [place]key. Pork trimmed of fat. All fish and seafood. Eggs. Dried beans, peas, or lentils. Unsalted nuts and seeds. Unsalted canned beans. Dairy  Low-fat dairy products, such as skim or 1% milk, 2% or reduced-fat cheeses, low-fat ricotta or cottage cheese, or plain low-fat yogurt. Low-sodium or reduced-sodium cheeses. Fats and Oils  Tub margarines without trans fats. Light or reduced-fat mayonnaise and salad dressings (reduced sodium). Avocado. Safflower, olive, or canola oils. Natural peanut or almond butter. Other  Unsalted popcorn and pretzels. The items listed above may not be a complete list of recommended foods or beverages. Contact your dietitian for more options.  What foods are not recommended? Grains  White bread. White pasta. White rice. Refined cornbread. Bagels and croissants. Crackers that contain trans fat. Vegetables  Creamed or fried vegetables. Vegetables in a cheese sauce. Regular canned vegetables. Regular canned tomato sauce and paste. Regular tomato and vegetable juices. Fruits  Canned fruit in light or heavy syrup. Fruit juice. Meat and Other Protein Products  Fatty cuts of meat. Ribs, chicken  wings, bacon, sausage, bologna, salami, chitterlings, fatback, hot dogs, bratwurst, and packaged luncheon meats. Salted nuts and seeds. Canned beans with salt. Dairy  Whole or 2% milk, cream,  half-and-half, and cream cheese. Whole-fat or sweetened yogurt. Full-fat cheeses or blue cheese. Nondairy creamers and whipped toppings. Processed cheese, cheese spreads, or cheese curds. Condiments  Onion and garlic salt, seasoned salt, table salt, and sea salt. Canned and packaged gravies. Worcestershire sauce. Tartar sauce. Barbecue sauce. Teriyaki sauce. Soy sauce, including reduced sodium. Steak sauce. Fish sauce. Oyster sauce. Cocktail sauce. Horseradish. Ketchup and mustard. Meat flavorings and tenderizers. Bouillon cubes. Hot sauce. Tabasco sauce. Marinades. Taco seasonings. Relishes. Fats and Oils  Butter, stick margarine, lard, shortening, ghee, and bacon fat. Coconut, palm kernel, or palm oils. Regular salad dressings. Other  Pickles and olives. Salted popcorn and pretzels. The items listed above may not be a complete list of foods and beverages to avoid. Contact your dietitian for more information.  Where can I find more information? National Heart, Lung, and Blood Institute: CablePromo.itwww.nhlbi.nih.gov/health/health-topics/topics/dash/ This information is not intended to replace advice given to you by your health care provider. Make sure you discuss any questions you have with your health care provider. Document Released: 01/10/2011 Document Revised: 06/29/2015 Document Reviewed: 11/25/2012 Elsevier Interactive Patient Education  2017 [employer]

## 2016-03-27 NOTE — Addendum Note (Signed)
Addended by: Santa LighterHAMMER, Adrian Specht K on: 03/27/2016 02:30 PM   Modules accepted: Orders

## 2016-04-10 ENCOUNTER — Encounter: Payer: 59 | Admitting: Pharmacist

## 2016-04-10 ENCOUNTER — Ambulatory Visit: Payer: BLUE CROSS/BLUE SHIELD | Attending: Family Medicine | Admitting: Pharmacist

## 2016-04-10 VITALS — BP 152/109 | HR 88

## 2016-04-10 DIAGNOSIS — I10 Essential (primary) hypertension: Secondary | ICD-10-CM | POA: Insufficient documentation

## 2016-04-10 DIAGNOSIS — Z79899 Other long term (current) drug therapy: Secondary | ICD-10-CM | POA: Insufficient documentation

## 2016-04-10 MED ORDER — LISINOPRIL-HYDROCHLOROTHIAZIDE 20-25 MG PO TABS: 1.0000 | ORAL_TABLET | Freq: Every day | ORAL | 2 refills | Status: DC

## 2016-04-10 NOTE — Progress Notes (Signed)
   S:    Patient arrives in good spirits.  Presents to the clinic for hypertension evaluation. Patient was referred on 03/13/16.  Patient was last seen by Primary Care Provider on 03/13/16.   Patient reports adherence with medications.  Current BP Medications include:  Amlodipine 10 mg daily, hydrochlorothiazide 25 mg daily.  Antihypertensives tried in the past include: none - newly diagnosed  Patient reports trying to eat less salt but isn't doing perfect at it.  O:   Last 3 Office BP readings: BP Readings from Last 3 Encounters:  03/27/16 (!) 145/98  03/20/16 (!) 159/90  03/13/16 (!) 157/100    BMET    Component Value Date/Time   NA 139 03/13/2016 1105   K 4.6 03/13/2016 1105   CL 105 03/13/2016 1105   CO2 25 03/13/2016 1105   GLUCOSE 103 (H) 03/13/2016 1105   BUN 19 03/13/2016 1105   CREATININE 1.60 (H) 03/13/2016 1105   CALCIUM 10.3 03/13/2016 1105   GFRNONAA 46 (L) 03/13/2016 1105   GFRAA 54 (L) 03/13/2016 1105    A/P: Hypertension newly diagnosed currently UNcontrolled on current medications. Continue amlodipine and combine hydrochlorothiazide with lisinopril - initiate lisinopril-hydrochlorothiazide 20-25 mg daily. Counseled on possible adverse effects including electrolyte abnormalities, risk of angioedema, and dry cough. Patient to return in 1-2 weeks for blood pressure check and BMET to monitor for any changes in creatinine or potassium.    Results reviewed and written information provided. Total time in face-to-face counseling 20 minutes.   F/U Clinic Visit with me in 1-2 weeks.  Patient seen with Cheral Bayasey Wells, PharmD Candidate

## 2016-04-10 NOTE — Patient Instructions (Addendum)
Thanks for coming to see me!  Start lisinopril-hydrochlorothiazide.   Stop the hydrochlorothiazide by itself.  Come back in 1-2 weeks  Hydrochlorothiazide, HCTZ; Lisinopril tablets What is this medicine? HYDROCHLOROTHIAZIDE; LISINOPRIL (hye droe klor oh THYE a zide; lyse IN oh pril) is a combination of a diuretic and an ACE inhibitor. It is used to treat high blood pressure. This medicine may be used for other purposes; ask your health care provider or pharmacist if you have questions. COMMON BRAND NAME(S): Prinzide, Zestoretic What should I tell my health care provider before I take this medicine? They need to know if you have any of these conditions: -bone marrow disease -decreased urine -heart or blood vessel disease -if you are on a special diet like a low salt diet -immune system problems, like lupus -kidney disease -liver disease -previous swelling of the tongue, face, or lips with difficulty breathing, difficulty swallowing, hoarseness, or tightening of the throat -recent heart attack or stroke -an unusual or allergic reaction to lisinopril, hydrochlorothiazide, sulfa drugs, other medicines, insect venom, foods, dyes, or preservatives -pregnant or trying to get pregnant -breast-feeding How should I use this medicine? Take this medicine by mouth with a glass of water. Follow the directions on the prescription label. You can take it with or without food. If it upsets your stomach, take it with food. Take your medicine at regular intervals. Do not take it more often than directed. Do not stop taking except on your doctor's advice. Talk to your pediatrician regarding the use of this medicine in children. Special care may be needed. Overdosage: If you think you have taken too much of this medicine contact a poison control center or emergency room at once. NOTE: This medicine is only for you. Do not share this medicine with others. What if I miss a dose? If you miss a dose, take it  as soon as you can. If it is almost time for your next dose, take only that dose. Do not take double or extra doses. What may interact with this medicine? Do not take this medication with any of the following medications: -sacubitril; valsartan This medicine may also interact with the following: -barbiturates like phenobarbital -blood pressure medicines -corticosteroids like prednisone -diabetic medications -diuretics, especially triamterene, spironolactone or amiloride -lithium -NSAIDs, medicines for pain and inflammation, like ibuprofen or naproxen -potassium salts or potassium supplements -prescription pain medicines -skeletal muscle relaxants like tubocurarine -some cholesterol lowering medications like cholestyramine or colestipol This list may not describe all possible interactions. Give your health care provider a list of all the medicines, herbs, non-prescription drugs, or dietary supplements you use. Also tell them if you smoke, drink alcohol, or use illegal drugs. Some items may interact with your medicine. What should I watch for while using this medicine? Visit your doctor or health care professional for regular checks on your progress. Check your blood pressure as directed. Ask your doctor or health care professional what your blood pressure should be and when you should contact him or her. Call your doctor or health care professional if you notice an irregular or fast heart beat. You must not get dehydrated. Ask your doctor or health care professional how much fluid you need to drink a day. Check with him or her if you get an attack of severe diarrhea, nausea and vomiting, or if you sweat a lot. The loss of too much body fluid can make it dangerous for you to take this medicine. Women should inform their doctor if they wish  to become pregnant or think they might be pregnant. There is a potential for serious side effects to an unborn child. Talk to your health care professional or  pharmacist for more information. You may get drowsy or dizzy. Do not drive, use machinery, or do anything that needs mental alertness until you know how this drug affects you. Do not stand or sit up quickly, especially if you are an older patient. This reduces the risk of dizzy or fainting spells. Alcohol can make you more drowsy and dizzy. Avoid alcoholic drinks. This medicine may affect your blood sugar level. If you have diabetes, check with your doctor or health care professional before changing the dose of your diabetic medicine. Avoid salt substitutes unless you are told otherwise by your doctor or health care professional. This medicine can make you more sensitive to the sun. Keep out of the sun. If you cannot avoid being in the sun, wear protective clothing and use sunscreen. Do not use sun lamps or tanning beds/booths. Do not treat yourself for coughs, colds, or pain while you are taking this medicine without asking your doctor or health care professional for advice. Some ingredients may increase your blood pressure. What side effects may I notice from receiving this medicine? Side effects that you should report to your doctor or health care professional as soon as possible: -changes in vision -confusion, dizziness, light headedness or fainting spells -decreased amount of urine passed -difficulty breathing or swallowing, hoarseness, or tightening of the throat -eye pain -fast or irregular heart beat, palpitations, or chest pain -muscle cramps -nausea and vomiting -persistent dry cough -redness, blistering, peeling or loosening of the skin, including inside the mouth -stomach pain -swelling of your face, lips, tongue, hands, or feet -unusual rash, bleeding or bruising, or pinpoint red spots on the skin -worsened gout pain -yellowing of the eyes or skin Side effects that usually do not require medical attention (report to your doctor or health care professional if they continue or are  bothersome): -change in sex drive or performance -cough -headache This list may not describe all possible side effects. Call your doctor for medical advice about side effects. You may report side effects to FDA at 1-800-FDA-1088. Where should I keep my medicine? Keep out of the reach of children. Store at room temperature between 20 and 25 degrees C (68 and 77 degrees F). Protect from moisture and excessive light. Keep container tightly closed. Throw away any unused medicine after the expiration date. NOTE: This sheet is a summary. It may not cover all possible information. If you have questions about this medicine, talk to your doctor, pharmacist, or health care provider.  2018 Elsevier/Gold Standard (2015-03-17 11:42:20)

## 2016-04-24 ENCOUNTER — Ambulatory Visit: Payer: BLUE CROSS/BLUE SHIELD | Attending: Family Medicine | Admitting: Pharmacist

## 2016-04-24 VITALS — BP 119/81 | HR 68

## 2016-04-24 DIAGNOSIS — Z79899 Other long term (current) drug therapy: Secondary | ICD-10-CM | POA: Insufficient documentation

## 2016-04-24 DIAGNOSIS — Z5181 Encounter for therapeutic drug level monitoring: Secondary | ICD-10-CM | POA: Insufficient documentation

## 2016-04-24 LAB — BASIC METABOLIC PANEL
BUN: 19 mg/dL (ref 7–25)
CHLORIDE: 100 mmol/L (ref 98–110)
CO2: 29 mmol/L (ref 20–31)
Calcium: 9.9 mg/dL (ref 8.6–10.3)
Creat: 1.55 mg/dL — ABNORMAL HIGH (ref 0.70–1.33)
GLUCOSE: 115 mg/dL — AB (ref 65–99)
Potassium: 4.6 mmol/L (ref 3.5–5.3)
Sodium: 136 mmol/L (ref 135–146)

## 2016-04-24 NOTE — Progress Notes (Signed)
   S:    Patient arrives in good spirits.  Presents to the clinic for hypertension evaluation. Patient was referred on 03/13/16.  Patient was last seen by Primary Care Provider on 03/13/16.   Patient reports adherence with medications.  Current BP Medications include:  Amlodipine 10 mg daily, lisinopril-hydrochlorothiazide 20-25 mg daily.  Antihypertensives tried in the past include: none - newly diagnosed   O:   Last 3 Office BP readings: BP Readings from Last 3 Encounters:  04/24/16 119/81  04/10/16 (!) 152/109  03/27/16 (!) 145/98    BMET    Component Value Date/Time   NA 139 03/13/2016 1105   K 4.6 03/13/2016 1105   CL 105 03/13/2016 1105   CO2 25 03/13/2016 1105   GLUCOSE 103 (H) 03/13/2016 1105   BUN 19 03/13/2016 1105   CREATININE 1.60 (H) 03/13/2016 1105   CALCIUM 10.3 03/13/2016 1105   GFRNONAA 46 (L) 03/13/2016 1105   GFRAA 54 (L) 03/13/2016 1105    A/P: Hypertension newly diagnosed currently controlled on current medications. No changes to medications at this time.  BMET ordered and will be obtained today. Encouraged patient to take all of his medications as prescribed and to not miss any doses. Patient verbalized understanding.   Results reviewed and written information provided. Total time in face-to-face counseling 20 minutes.   F/U Clinic Visit with [name] in 2 months.  Patient seen with Cheral Bayasey Wells, PharmD Candidate

## 2016-04-24 NOTE — Patient Instructions (Addendum)
Thanks for coming to see Alexander Hunt!  Your blood pressure is great!!!  We will get lab work today  Follow up with [name] in 2 months

## 2016-05-01 ENCOUNTER — Ambulatory Visit (AMBULATORY_SURGERY_CENTER): Payer: Self-pay | Admitting: *Deleted

## 2016-05-01 VITALS — Ht 76.0 in | Wt 250.0 lb

## 2016-05-01 DIAGNOSIS — Z1211 Encounter for screening for malignant neoplasm of colon: Secondary | ICD-10-CM

## 2016-05-01 NOTE — Progress Notes (Signed)
No egg or soy allergy known to patient  No past sedation with any surgeries  or procedures, no past  intubation  No diet pills per patient No home 02 use per patient  No blood thinners per patient  Pt denies issues with constipation  No A fib or A flutter   No e mail for emmi

## 2016-05-02 ENCOUNTER — Telehealth: Payer: Self-pay | Admitting: *Deleted

## 2016-05-02 NOTE — Telephone Encounter (Signed)
Please inform patient of kidney function being stable. Medical Assistant left message on patient's home and cell voicemail. Voicemail states to give [place] back [place] [facility] at [phone].

## 2016-05-02 NOTE — Telephone Encounter (Signed)
-----   Message from Quentin Angstlugbemiga E Jegede, MD sent at 04/30/2016 10:56 AM EDT ----- Stable kidney function

## 2016-05-07 ENCOUNTER — Telehealth: Payer: Self-pay

## 2016-05-07 NOTE — Telephone Encounter (Signed)
Pt returned call for lab results gave pt results. Pt doesn't have any questions or concerns

## 2016-05-15 ENCOUNTER — Encounter: Payer: 59 | Admitting: Internal Medicine

## 2016-06-07 ENCOUNTER — Encounter: Payer: Self-pay | Admitting: Internal Medicine

## 2016-06-21 ENCOUNTER — Ambulatory Visit (AMBULATORY_SURGERY_CENTER): Payer: BLUE CROSS/BLUE SHIELD | Admitting: Internal Medicine

## 2016-06-21 ENCOUNTER — Encounter: Payer: Self-pay | Admitting: Internal Medicine

## 2016-06-21 VITALS — BP 141/88 | HR 63 | Temp 99.5°F | Resp 22 | Ht 76.0 in | Wt 250.0 lb

## 2016-06-21 DIAGNOSIS — Z1212 Encounter for screening for malignant neoplasm of rectum: Secondary | ICD-10-CM | POA: Diagnosis not present

## 2016-06-21 DIAGNOSIS — Z1211 Encounter for screening for malignant neoplasm of colon: Secondary | ICD-10-CM | POA: Diagnosis not present

## 2016-06-21 MED ORDER — SODIUM CHLORIDE 0.9 % IV SOLN: 500.0000 mL | INTRAVENOUS | Status: AC

## 2016-06-21 NOTE — Op Note (Signed)
[facility] Patient Name: Roberta Kelly Procedure Date: 06/21/2016 2:05 PM MRN: [id number] Endoscopist: Iva Boop , MD Age: 61 Referring MD:  Date of Birth: [birth year] Gender: Male Account #: [id number] Procedure:                Colonoscopy Indications:              Screening for colorectal malignant neoplasm Medicines:                Propofol per Anesthesia, Monitored Anesthesia Care Procedure:                Pre-Anesthesia Assessment:                           - Prior to the procedure, a History and Physical                            was performed, and patient medications and                            allergies were reviewed. The patient's tolerance of                            previous anesthesia was also reviewed. The risks                            and benefits of the procedure and the sedation                            options and risks were discussed with the patient.                            All questions were answered, and informed consent                            was obtained. Prior Anticoagulants: The patient has                            taken no previous anticoagulant or antiplatelet                            agents. ASA Grade Assessment: II - A patient with                            mild systemic disease. After reviewing the risks                            and benefits, the patient was deemed in                            satisfactory condition to undergo the procedure.                           After obtaining informed consent, the colonoscope  was passed under direct vision. Throughout the                            procedure, the patient's blood pressure, pulse, and                            oxygen saturations were monitored continuously. The                            Model CF-HQ190L [id number]) scope was introduced                            through the anus and advanced to the the cecum,                             identified by appendiceal orifice and ileocecal                            valve. The quality of the bowel preparation was                            adequate. The colonoscopy was performed without                            difficulty. The patient tolerated the procedure                            well. The bowel preparation used was Miralax. The                            ileocecal valve, appendiceal orifice, and rectum                            were photographed. Scope In: 2:11:08 PM Scope Out: 2:26:22 PM Scope Withdrawal Time: 0 hours 12 minutes 12 seconds  Total Procedure Duration: 0 hours 15 minutes 14 seconds  Findings:                 The perianal and digital rectal examinations were                            normal. Pertinent negatives include normal prostate                            (size, shape, and consistency).                           The colon (entire examined portion) appeared normal.                           No additional abnormalities were found on                            retroflexion. Complications:            No immediate complications. Estimated  blood loss:                            None. Estimated Blood Loss:     Estimated blood loss: none. Recommendation:           - Repeat colonoscopy in 10 years for screening                            purposes.                           - Patient has a contact number available for                            emergencies. The signs and symptoms of potential                            delayed complications were discussed with the                            patient. Return to normal activities tomorrow.                            Written discharge instructions were provided to the                            patient.                           - Resume previous diet.                           - Continue present medications. Iva Booparl E Navin Dogan, MD 06/21/2016 2:34:06 PM This report has been signed electronically.

## 2016-06-21 NOTE — Progress Notes (Signed)
Report to PACU, RN, vss, BBS= Clear.  

## 2016-06-21 NOTE — Patient Instructions (Addendum)
   Normal colonoscopy - no polyps, no cancer seen.  Next routine colonoscopy or other screening test in 10 years - 2028  I appreciate the opportunity to care for you. Iva Booparl E. Ekam Besson, MD, FACG   YOU HAD AN ENDOSCOPIC PROCEDURE TODAY AT THE [facility]:   Refer to the procedure report that was given to you for any specific questions about what was found during the examination.  If the procedure report does not answer your questions, please call your gastroenterologist to clarify.  If you requested that your care partner not be given the details of your procedure findings, then the procedure report has been included in a sealed envelope for you to review at your convenience later.  YOU SHOULD EXPECT: Some feelings of bloating in the abdomen. Passage of more gas than usual.  Walking can help get rid of the air that was put into your GI tract during the procedure and reduce the bloating. If you had a lower endoscopy (such as a colonoscopy or flexible sigmoidoscopy) you may notice spotting of blood in your stool or on the toilet paper. If you underwent a bowel prep for your procedure, you may not have a normal bowel movement for a few days.  Please Note:  You might notice some irritation and congestion in your nose or some drainage.  This is from the oxygen used during your procedure.  There is no need for concern and it should clear up in a day or so.  SYMPTOMS TO REPORT IMMEDIATELY:   Following lower endoscopy (colonoscopy or flexible sigmoidoscopy):  Excessive amounts of blood in the stool  Significant tenderness or worsening of abdominal pains  Swelling of the abdomen that is new, acute  Fever of 100F or higher    For urgent or emergent issues, a gastroenterologist can be reached at any hour by calling [phone].   DIET:  We do recommend a small meal at first, but then you may proceed to your regular diet.  Drink plenty of fluids but you should avoid alcoholic  beverages for 24 hours.  ACTIVITY:  You should plan to take it easy for the rest of today and you should NOT DRIVE or use heavy machinery until tomorrow (because of the sedation medicines used during the test).    FOLLOW UP: Our staff will call the number listed on your records the next business day following your procedure to check on you and address any questions or concerns that you may have regarding the information given to you following your procedure. If we do not reach you, we will leave a message.  However, if you are feeling well and you are not experiencing any problems, there is no need to return our call.  We will assume that you have returned to your regular daily activities without incident.  If any biopsies were taken you will be contacted by phone or by letter within the next 1-3 weeks.  Please call us at [phone] if you have not heard about the biopsies in 3 weeks.    SIGNATURES/CONFIDENTIALITY: You and/or your care partner have signed paperwork which will be entered into your electronic medical record.  These signatures attest to the fact that that the information above on your After Visit Summary has been reviewed and is understood.  Full responsibility of the confidentiality of this discharge information lies with you and/or your care-partner.

## 2016-06-21 NOTE — Progress Notes (Signed)
Pt's states no medical or surgical changes since previsit or office visit. 

## 2016-06-24 ENCOUNTER — Telehealth: Payer: Self-pay | Admitting: *Deleted

## 2016-06-24 ENCOUNTER — Ambulatory Visit: Payer: BLUE CROSS/BLUE SHIELD | Admitting: Family Medicine

## 2016-06-24 NOTE — Telephone Encounter (Signed)
  Follow up Call-  Call back number 06/21/2016  Post procedure Call Back phone  # [phone]  Permission to leave phone message Yes  Some recent data might be hidden     Patient questions:  Do you have a fever, pain , or abdominal swelling? No. Pain Score  0 *  Have you tolerated food without any problems? Yes.    Have you been able to return to your normal activities? Yes.    Do you have any questions about your discharge instructions: Diet   No. Medications  No. Follow up visit  No.  Do you have questions or concerns about your Care? No.  Actions: * If pain score is 4 or above: No action needed, pain <4.  

## 2016-06-24 NOTE — Telephone Encounter (Signed)
  Follow up Call-  Call back number 06/21/2016  Post procedure Call Back phone  # [phone]  Permission to leave phone message Yes  Some recent data might be hidden     Patient questions:  Do you have a fever, pain , or abdominal swelling? No. Pain Score  0 *  Have you tolerated food without any problems? Yes.    Have you been able to return to your normal activities? Yes.    Do you have any questions about your discharge instructions: Diet   No. Medications  No. Follow up visit  No.  Do you have questions or concerns about your Care? No.  Actions: * If pain score is 4 or above: No action needed, pain <4.

## 2016-06-27 ENCOUNTER — Ambulatory Visit: Payer: BLUE CROSS/BLUE SHIELD | Admitting: Family Medicine

## 2016-07-09 ENCOUNTER — Encounter: Payer: Self-pay | Admitting: Family Medicine

## 2016-07-09 ENCOUNTER — Ambulatory Visit: Payer: BLUE CROSS/BLUE SHIELD | Attending: Family Medicine | Admitting: Family Medicine

## 2016-07-09 VITALS — BP 144/89 | HR 65 | Temp 97.8°F | Resp 18 | Ht 76.0 in | Wt 252.0 lb

## 2016-07-09 DIAGNOSIS — N529 Male erectile dysfunction, unspecified: Secondary | ICD-10-CM

## 2016-07-09 DIAGNOSIS — Z8639 Personal history of other endocrine, nutritional and metabolic disease: Secondary | ICD-10-CM

## 2016-07-09 DIAGNOSIS — E559 Vitamin D deficiency, unspecified: Secondary | ICD-10-CM | POA: Insufficient documentation

## 2016-07-09 DIAGNOSIS — E782 Mixed hyperlipidemia: Secondary | ICD-10-CM

## 2016-07-09 DIAGNOSIS — I10 Essential (primary) hypertension: Secondary | ICD-10-CM | POA: Diagnosis not present

## 2016-07-09 DIAGNOSIS — E785 Hyperlipidemia, unspecified: Secondary | ICD-10-CM | POA: Diagnosis not present

## 2016-07-09 DIAGNOSIS — Z7982 Long term (current) use of aspirin: Secondary | ICD-10-CM | POA: Insufficient documentation

## 2016-07-09 LAB — POCT UA - MICROALBUMIN
Creatinine, POC: 200 mg/dL
MICROALBUMIN (UR) POC: 10 mg/L

## 2016-07-09 MED ORDER — VITAMIN D (ERGOCALCIFEROL) 1.25 MG (50000 UNIT) PO CAPS: 50000.0000 [IU] | ORAL_CAPSULE | ORAL | 0 refills | Status: AC

## 2016-07-09 MED ORDER — LISINOPRIL 20 MG PO TABS: 20.0000 mg | ORAL_TABLET | Freq: Every day | ORAL | 0 refills | Status: DC

## 2016-07-09 MED ORDER — TADALAFIL 20 MG PO TABS: 10.0000 mg | ORAL_TABLET | Freq: Every day | ORAL | 11 refills | Status: DC | PRN

## 2016-07-09 MED ORDER — AMLODIPINE BESYLATE 10 MG PO TABS: 10.0000 mg | ORAL_TABLET | Freq: Every day | ORAL | 0 refills | Status: DC

## 2016-07-09 MED ORDER — HYDROCHLOROTHIAZIDE 25 MG PO TABS: 25.0000 mg | ORAL_TABLET | Freq: Every day | ORAL | 0 refills | Status: DC

## 2016-07-09 NOTE — Progress Notes (Signed)
Subjective:  Alexander ID: Alexander Hunt, male    DOB: Oct 08, 1955  Age: 61 y.o. MRN: [id number]  CC: Hypertension    HPI Alexander Hunt presents for hypertension follow up.Marland Kitchen He is not exercising and is not adherent to low salt diet.   He does not check BP at home. well controlled at home. Cardiac symptoms none. Alexander denies chest pain, chest pressure/discomfort, claudication, dyspnea, lower extremity edema, near-syncope and palpitations.  Cardiovascular risk factors: advanced age (older than 85 for men, 90 for women), dyslipidemia, hypertension, male gender, sedentary lifestyle and smoking/ tobacco exposure. Use of agents associated with hypertension: none. History of target organ damage: none. He is also requesting medication refills.   History of vitamin D deficiency: He Hunt not picking up prescription for supplementation.    Outpatient Medications Prior to Visit  Medication Sig Dispense Refill  . aspirin EC 81 MG tablet Take 1 tablet (81 mg total) by mouth daily. 30 tablet 2  . atorvastatin (LIPITOR) 20 MG tablet Take 1 tablet (20 mg total) by mouth daily. 30 tablet 2  . traMADol (ULTRAM) 50 MG tablet Take 1 tablet (50 mg total) by mouth every 6 (six) hours as needed for pain. 15 tablet 0  . amLODipine (NORVASC) 10 MG tablet Take 1 tablet (10 mg total) by mouth daily. 30 tablet 3  . hydrochlorothiazide (HYDRODIURIL) 25 MG tablet Take 1 tablet (25 mg total) by mouth daily. (Alexander not taking: Reported on 06/21/2016) 90 tablet 3  . lisinopril-hydrochlorothiazide (PRINZIDE,ZESTORETIC) 20-25 MG tablet Take 1 tablet by mouth daily. (Alexander not taking: Reported on 05/01/2016) 30 tablet 2  . tadalafil (CIALIS) 20 MG tablet Take 0.5-1 tablets (10-20 mg total) by mouth daily as needed for erectile dysfunction. 5 tablet 2   Facility-Administered Medications Prior to Visit  Medication Dose Route Frequency Provider Last Rate Last Dose  . 0.9 %  sodium chloride infusion  500 mL Intravenous  Continuous Gatha Mayer, MD        ROS Review of Systems  Constitutional: Negative.   Eyes: Negative.   Respiratory: Negative.   Cardiovascular: Negative.   Gastrointestinal: Negative.   Musculoskeletal: Negative.   Skin: Negative.   Neurological: Negative.  Negative for dizziness and light-headedness.  Psychiatric/Behavioral: Negative.     Objective:  BP (!) 144/89 (BP Location: Left Arm, Alexander Position: Sitting, Cuff Size: Normal)   Pulse 65   Temp 97.8 F (36.6 C) (Oral)   Resp 18   Ht 6' 4"  (1.93 m)   Wt 252 lb (114.3 kg)   SpO2 100%   BMI 30.67 kg/m   BP/Weight 07/09/2016 06/21/2016 9/79/8921  Systolic BP 194 174 -  Diastolic BP 89 88 -  Wt. (Lbs) 252 250 250  BMI 30.67 30.43 30.43     Physical Exam  Constitutional: He appears well-developed and well-nourished.  Eyes: Conjunctivae are normal. Pupils are equal, round, and reactive to light.  Neck: No JVD present.  Cardiovascular: Normal rate, regular rhythm, normal heart sounds and intact distal pulses.   Pulmonary/Chest: Effort normal and breath sounds normal.  Abdominal: Soft. Bowel sounds are normal.  Musculoskeletal: Normal range of motion.  Skin: Skin is warm and dry.  Psychiatric: He has a normal mood and affect.  Nursing note and vitals reviewed.  Assessment & Plan:   Problem List Items Addressed This Visit      Cardiovascular and Mediastinum   Hypertension - Primary   Patent Hunt not taking HTCZ for BP. [place] reordered  for better  BP control.    Follow up with clinical pharmacist in 2 weeks.   Follow up with PCP in 3 months.   Relevant Medications   tadalafil (CIALIS) 20 MG tablet   amLODipine (NORVASC) 10 MG tablet   hydrochlorothiazide (HYDRODIURIL) 25 MG tablet   lisinopril (PRINIVIL,ZESTRIL) 20 MG tablet   Other Relevant Orders   Lipid Panel (Completed)   POCT UA - Microalbumin (Completed)   CMP14+EGFR (Completed)     Other   Hyperlipidemia   Relevant Medications   tadalafil  (CIALIS) 20 MG tablet   amLODipine (NORVASC) 10 MG tablet   hydrochlorothiazide (HYDRODIURIL) 25 MG tablet   lisinopril (PRINIVIL,ZESTRIL) 20 MG tablet   Other Relevant Orders   Lipid Panel (Completed)    Other Visit Diagnoses    History of vitamin D deficiency       Relevant Medications   Vitamin D, Ergocalciferol, (DRISDOL) 50000 units CAPS capsule   Erectile dysfunction, unspecified erectile dysfunction type       Relevant Medications   tadalafil (CIALIS) 20 MG tablet      Meds ordered this encounter  Medications  . tadalafil (CIALIS) 20 MG tablet    Sig: Take 0.5-1 tablets (10-20 mg total) by mouth daily as needed for erectile dysfunction.    Dispense:  5 tablet    Refill:  11    Order Specific Question:   Supervising Provider    Answer:   Tresa Garter [id number]  . amLODipine (NORVASC) 10 MG tablet    Sig: Take 1 tablet (10 mg total) by mouth daily.    Dispense:  90 tablet    Refill:  0    Order Specific Question:   Supervising Provider    Answer:   Tresa Garter [id number]  . hydrochlorothiazide (HYDRODIURIL) 25 MG tablet    Sig: Take 1 tablet (25 mg total) by mouth daily.    Dispense:  90 tablet    Refill:  0    Order Specific Question:   Supervising Provider    Answer:   Tresa Garter [id number]  . lisinopril (PRINIVIL,ZESTRIL) 20 MG tablet    Sig: Take 1 tablet (20 mg total) by mouth daily.    Dispense:  90 tablet    Refill:  0    Order Specific Question:   Supervising Provider    Answer:   Tresa Garter [id number]  . Vitamin D, Ergocalciferol, (DRISDOL) 50000 units CAPS capsule    Sig: Take 1 capsule (50,000 Units total) by mouth every 7 (seven) days.    Dispense:  12 capsule    Refill:  0    Order Specific Question:   Supervising Provider    Answer:   Tresa Garter [[id number]]    Follow-up: Return in about 2 weeks (around 07/23/2016) for BP check with clinical pharmacist.    Alfonse Spruce FNP

## 2016-07-09 NOTE — Progress Notes (Signed)
Patient is here for f/up  

## 2016-07-10 LAB — CMP14+EGFR
A/G RATIO: 1.6 (ref 1.2–2.2)
ALBUMIN: 4.5 g/dL (ref 3.5–5.5)
ALK PHOS: 84 IU/L (ref 39–117)
ALT: 31 IU/L (ref 0–44)
AST: 34 IU/L (ref 0–40)
BILIRUBIN TOTAL: 0.5 mg/dL (ref 0.0–1.2)
BUN / CREAT RATIO: 10 (ref 9–20)
BUN: 14 mg/dL (ref 6–24)
CO2: 24 mmol/L (ref 18–29)
Calcium: 9.8 mg/dL (ref 8.7–10.2)
Chloride: 102 mmol/L (ref 96–106)
Creatinine, Ser: 1.42 mg/dL — ABNORMAL HIGH (ref 0.76–1.27)
GFR calc Af Amer: 62 mL/min/{1.73_m2} (ref >59)
GFR calc non Af Amer: 54 mL/min/{1.73_m2} — ABNORMAL LOW (ref >59)
GLOBULIN, TOTAL: 2.8 g/dL (ref 1.5–4.5)
Glucose: 129 mg/dL — ABNORMAL HIGH (ref 65–99)
POTASSIUM: 5 mmol/L (ref 3.5–5.2)
SODIUM: 142 mmol/L (ref 134–144)
Total Protein: 7.3 g/dL (ref 6.0–8.5)

## 2016-07-10 LAB — LIPID PANEL
CHOLESTEROL TOTAL: 195 mg/dL (ref 100–199)
Chol/HDL Ratio: 4.4 ratio (ref 0.0–5.0)
HDL: 44 mg/dL (ref >39)
LDL CALC: 130 mg/dL — AB (ref 0–99)
TRIGLYCERIDES: 105 mg/dL (ref 0–149)
VLDL CHOLESTEROL CAL: 21 mg/dL (ref 5–40)

## 2016-07-15 ENCOUNTER — Other Ambulatory Visit: Payer: Self-pay | Admitting: Family Medicine

## 2016-07-15 DIAGNOSIS — E782 Mixed hyperlipidemia: Secondary | ICD-10-CM

## 2016-07-15 DIAGNOSIS — I10 Essential (primary) hypertension: Secondary | ICD-10-CM

## 2016-07-15 MED ORDER — LISINOPRIL 40 MG PO TABS: 40.0000 mg | ORAL_TABLET | Freq: Every day | ORAL | 2 refills | Status: DC

## 2016-07-15 MED ORDER — ATORVASTATIN CALCIUM 40 MG PO TABS: 40.0000 mg | ORAL_TABLET | Freq: Every day | ORAL | 2 refills | Status: DC

## 2016-07-16 ENCOUNTER — Telehealth: Payer: Self-pay

## 2016-07-16 NOTE — Telephone Encounter (Signed)
-----   Message from Lizbeth BarkMandesia R Hairston, FNP sent at 07/15/2016 12:19 PM EDT ----- -Kidney function is stable since last check but is still decreased. Levels indicate you have chronic kidney disease stage 3.  -Your hydrochlorothiazide will be discontinued. Your dose of lisinopril will be increased.  -Continue to take your medications for blood pressure, avoid taking NSAID medications, reduce salt intake to 2 to 4 grams/day, do not smoke. -Lipid levels have improved but are still elevated. This can increase your risk of heart disease. Your dose of atorvastatin will be increased.  -Recommend monitoring again in 3 months. If your levels have signifcantly increased you will be referred to nephrology. .Marland Kitchen

## 2016-07-16 NOTE — Telephone Encounter (Signed)
CMA call regarding lab results   Patient Verify DOB   Patient was aware and understood  

## 2016-07-24 ENCOUNTER — Ambulatory Visit: Payer: BLUE CROSS/BLUE SHIELD | Attending: Family Medicine | Admitting: Pharmacist

## 2016-07-24 VITALS — BP 143/91

## 2016-07-24 DIAGNOSIS — I10 Essential (primary) hypertension: Secondary | ICD-10-CM

## 2016-07-24 DIAGNOSIS — Z79899 Other long term (current) drug therapy: Secondary | ICD-10-CM | POA: Diagnosis not present

## 2016-07-24 NOTE — Patient Instructions (Addendum)
Thank you for coming to see us!  Take the lisinopril. Stop the hydrochlorothiazide.  Come back in 2 weeks   DASH Eating Plan DASH stands for "Dietary Approaches to Stop Hypertension." The DASH eating plan is a healthy eating plan that has been shown to reduce high blood pressure (hypertension). It may also reduce your risk for type 2 diabetes, heart disease, and stroke. The DASH eating plan may also help with weight loss. What are tips for following this plan? General guidelines  Avoid eating more than 2,300 mg (milligrams) of salt (sodium) a day. If you have hypertension, you may need to reduce your sodium intake to 1,500 mg a day.  Limit alcohol intake to no more than 1 drink a day for nonpregnant women and 2 drinks a day for men. One drink equals 12 oz of beer, 5 oz of wine, or 1 oz of hard liquor.  Work with your health care provider to maintain a healthy body weight or to lose weight. Ask what an ideal weight is for you.  Get at least 30 minutes of exercise that causes your heart to beat faster (aerobic exercise) most days of the week. Activities may include walking, swimming, or biking.  Work with your health care provider or diet and nutrition specialist (dietitian) to adjust your eating plan to your individual calorie needs. Reading food labels  Check food labels for the amount of sodium per serving. Choose foods with less than 5 percent of the Daily Value of sodium. Generally, foods with less than 300 mg of sodium per serving fit into this eating plan.  To find whole grains, look for the word "whole" as the first word in the ingredient list. Shopping  Buy products labeled as "low-sodium" or "no salt added."  Buy fresh foods. Avoid canned foods and premade or frozen meals. Cooking  Avoid adding salt when cooking. Use salt-free seasonings or herbs instead of table salt or sea salt. Check with your health care provider or pharmacist before using salt substitutes.  Do not fry  foods. Cook foods using healthy methods such as baking, boiling, grilling, and broiling instead.  Cook with heart-healthy oils, such as olive, canola, soybean, or sunflower oil. Meal planning   Eat a balanced diet that includes: ? 5 or more servings of fruits and vegetables each day. At each meal, try to fill half of your plate with fruits and vegetables. ? Up to 6-8 servings of whole grains each day. ? Less than 6 oz of lean meat, poultry, or fish each day. A 3-oz serving of meat is about the same size as a deck of cards. One egg equals 1 oz. ? 2 servings of low-fat dairy each day. ? A serving of nuts, seeds, or beans 5 times each week. ? Heart-healthy fats. Healthy fats called Omega-3 fatty acids are found in foods such as flaxseeds and coldwater fish, like sardines, salmon, and mackerel.  Limit how much you eat of the following: ? Canned or prepackaged foods. ? Food that is high in trans fat, such as fried foods. ? Food that is high in saturated fat, such as fatty meat. ? Sweets, desserts, sugary drinks, and other foods with added sugar. ? Full-fat dairy products.  Do not salt foods before eating.  Try to eat at least 2 vegetarian meals each week.  Eat more home-cooked food and less restaurant, buffet, and fast food.  When eating at a restaurant, ask that your food be prepared with less salt or no  salt, if possible. What foods are recommended? The items listed may not be a complete list. Talk with your dietitian about what dietary choices are best for you. Grains Whole-grain or whole-wheat bread. Whole-grain or whole-wheat pasta. Brown rice. [place]. Bulgur. Whole-grain and low-sodium cereals. Pita bread. Low-fat, low-sodium crackers. Whole-wheat flour tortillas. Vegetables Fresh or frozen vegetables (raw, steamed, roasted, or grilled). Low-sodium or reduced-sodium tomato and vegetable juice. Low-sodium or reduced-sodium tomato sauce and tomato paste. Low-sodium or  reduced-sodium canned vegetables. Fruits All fresh, dried, or frozen fruit. Canned fruit in natural juice (without added sugar). Meat and other protein foods Skinless chicken or [place]. Ground chicken or [place]. Pork with fat trimmed off. Fish and seafood. Egg whites. Dried beans, peas, or lentils. Unsalted nuts, nut butters, and seeds. Unsalted canned beans. Lean cuts of beef with fat trimmed off. Low-sodium, lean deli meat. Dairy Low-fat (1%) or fat-free (skim) milk. Fat-free, low-fat, or reduced-fat cheeses. Nonfat, low-sodium ricotta or cottage cheese. Low-fat or nonfat yogurt. Low-fat, low-sodium cheese. Fats and oils Soft margarine without trans fats. Vegetable oil. Low-fat, reduced-fat, or light mayonnaise and salad dressings (reduced-sodium). Canola, safflower, olive, soybean, and sunflower oils. Avocado. Seasoning and other foods Herbs. Spices. Seasoning mixes without salt. Unsalted popcorn and pretzels. Fat-free sweets. What foods are not recommended? The items listed may not be a complete list. Talk with your dietitian about what dietary choices are best for you. Grains Baked goods made with fat, such as croissants, muffins, or some breads. Dry pasta or rice meal packs. Vegetables Creamed or fried vegetables. Vegetables in a cheese sauce. Regular canned vegetables (not low-sodium or reduced-sodium). Regular canned tomato sauce and paste (not low-sodium or reduced-sodium). Regular tomato and vegetable juice (not low-sodium or reduced-sodium). Angie Fava. Olives. Fruits Canned fruit in a light or heavy syrup. Fried fruit. Fruit in cream or butter sauce. Meat and other protein foods Fatty cuts of meat. Ribs. Fried meat. Berniece Salines. Sausage. Bologna and other processed lunch meats. Salami. Fatback. Hotdogs. Bratwurst. Salted nuts and seeds. Canned beans with added salt. Canned or smoked fish. Whole eggs or egg yolks. Chicken or [place] with skin. Dairy Whole or 2% milk, cream, and half-and-half.  Whole or full-fat cream cheese. Whole-fat or sweetened yogurt. Full-fat cheese. Nondairy creamers. Whipped toppings. Processed cheese and cheese spreads. Fats and oils Butter. Stick margarine. Lard. Shortening. Ghee. Bacon fat. Tropical oils, such as coconut, palm kernel, or palm oil. Seasoning and other foods Salted popcorn and pretzels. Onion salt, garlic salt, seasoned salt, table salt, and sea salt. Worcestershire sauce. Tartar sauce. Barbecue sauce. Teriyaki sauce. Soy sauce, including reduced-sodium. Steak sauce. Canned and packaged gravies. Fish sauce. Oyster sauce. Cocktail sauce. Horseradish that you find on the shelf. Ketchup. Mustard. Meat flavorings and tenderizers. Bouillon cubes. Hot sauce and Tabasco sauce. Premade or packaged marinades. Premade or packaged taco seasonings. Relishes. Regular salad dressings. Where to find more information:  National Heart, Lung, and [place]: https://wilson-eaton.com/  American Heart Association: www.heart.org Summary  The DASH eating plan is a healthy eating plan that has been shown to reduce high blood pressure (hypertension). It may also reduce your risk for type 2 diabetes, heart disease, and stroke.  With the DASH eating plan, you should limit salt (sodium) intake to 2,300 mg a day. If you have hypertension, you may need to reduce your sodium intake to 1,500 mg a day.  When on the DASH eating plan, aim to eat more fresh fruits and vegetables, whole grains, lean proteins, low-fat dairy, and heart-healthy  fats.  Work with your health care provider or diet and nutrition specialist (dietitian) to adjust your eating plan to your individual calorie needs. This information is not intended to replace advice given to you by your health care provider. Make sure you discuss any questions you have with your health care provider. Document Released: 01/10/2011 Document Revised: 01/15/2016 Document Reviewed: 01/15/2016 Elsevier Interactive Patient Education   2017 [employer].

## 2016-07-24 NOTE — Progress Notes (Signed)
   S:    Patient arrives in good spirits.  Presents to the clinic for hypertension evaluation. Patient was referred on 07/09/16.  Patient was last seen by Primary Care Provider on 07/09/16.   Patient reports adherence with medications.  Current BP Medications include:  Amlodipine 10 mg daily, lisinopril 40 mg daily. However patient has not stopped the lisinopril-HCTZ and did not start the new lisinopril dose.  Antihypertensives tried in the past include: hydrochlorothiazide (renal function) but has not stopped yet.  He is not adherent to a low salt diet. He reports that he doesn't understand the phone message he got and would like to review it.   O:   Last 3 Office BP readings: BP Readings from Last 3 Encounters:  07/09/16 (!) 144/89  06/21/16 (!) 141/88  04/24/16 119/81    BMET    Component Value Date/Time   NA 142 07/09/2016 0936   K 5.0 07/09/2016 0936   CL 102 07/09/2016 0936   CO2 24 07/09/2016 0936   GLUCOSE 129 (H) 07/09/2016 0936   GLUCOSE 115 (H) 04/24/2016 0949   BUN 14 07/09/2016 0936   CREATININE 1.42 (H) 07/09/2016 0936   CREATININE 1.55 (H) 04/24/2016 0949   CALCIUM 9.8 07/09/2016 0936   GFRNONAA 54 (L) 07/09/2016 0936   GFRNONAA 46 (L) 03/13/2016 1105   GFRAA 62 07/09/2016 0936   GFRAA 54 (L) 03/13/2016 1105    A/P: Hypertension newly diagnosed earlier this year currently not controlled on current medications because he did not switch his medications as instructed. Reviewed the medication changes made by PCP and patient verbalizes understanding. Education provided on DASH diet.   Results reviewed and written information provided. Total time in face-to-face counseling 20 minutes.   F/U Clinic Visit with me in 2 weeks for a blood pressure check.  Patient seen with Dutch QuintKaley Whitesell, PharmD Candidate and Louis MatteJay Worthington, PharmD Candidate

## 2016-08-14 ENCOUNTER — Ambulatory Visit: Payer: BLUE CROSS/BLUE SHIELD | Attending: Family Medicine | Admitting: Pharmacist

## 2016-08-14 VITALS — BP 121/82 | HR 64

## 2016-08-14 DIAGNOSIS — I10 Essential (primary) hypertension: Secondary | ICD-10-CM

## 2016-08-14 NOTE — Patient Instructions (Addendum)
Thanks for coming to see me!  Your blood pressure is great!  Here are some medications you can take for your cold:  Stuffy nose: saline nasal spray Cough: dextromethorphan and drink water. If you are coughing phlegm up, you can take guaifenesin but water is still the best. Runny nose and watery eyes: loratadine (you can take Benadryl at night - it will make you sleepy)  You can take DayQuil or NyQuil.  Due to your blood pressure - avoid pseudoephedrine as it can increase your blood pressure. This medication has to be bought from behind the counter in the pharmacy. Also avoid phenylephrine as it can increase you blood pressure too.  [facility]

## 2016-08-14 NOTE — Progress Notes (Signed)
   S:    Patient arrives in good spirits.  Presents to the clinic for hypertension evaluation. Patient was referred on 07/09/16.  Patient was last seen by Primary Care Provider on 07/09/16.   Patient reports adherence with medications.  Current BP Medications include:  Amlodipine 10 mg daily, lisinopril 40 mg daily.   Antihypertensives tried in the past include: hydrochlorothiazide (renal function)   Patient has been trying to watch salt.   He reports he has a cold and wants to know what medications are best for him to take.  O:   Last 3 Office BP readings: BP Readings from Last 3 Encounters:  08/14/16 121/82  07/24/16 (!) 143/91  07/09/16 (!) 144/89    BMET    Component Value Date/Time   NA 142 07/09/2016 0936   K 5.0 07/09/2016 0936   CL 102 07/09/2016 0936   CO2 24 07/09/2016 0936   GLUCOSE 129 (H) 07/09/2016 0936   GLUCOSE 115 (H) 04/24/2016 0949   BUN 14 07/09/2016 0936   CREATININE 1.42 (H) 07/09/2016 0936   CREATININE 1.55 (H) 04/24/2016 0949   CALCIUM 9.8 07/09/2016 0936   GFRNONAA 54 (L) 07/09/2016 0936   GFRNONAA 46 (L) 03/13/2016 1105   GFRAA 62 07/09/2016 0936   GFRAA 54 (L) 03/13/2016 1105    A/P: Hypertension newly diagnosed earlier this year currently controlled on current medications. Provided information on medications to take for his cold and to avoid pseudoephedrine and phenylephrine as they can increase his blood pressure. Patient verbalized understanding.  Results reviewed and written information provided. Total time in face-to-face counseling 10 minutes.   [facility]

## 2016-10-24 ENCOUNTER — Ambulatory Visit: Payer: BLUE CROSS/BLUE SHIELD | Admitting: Family Medicine

## 2016-11-01 ENCOUNTER — Encounter: Payer: Self-pay | Admitting: Family Medicine

## 2016-11-01 ENCOUNTER — Ambulatory Visit: Payer: BLUE CROSS/BLUE SHIELD | Attending: Family Medicine | Admitting: Family Medicine

## 2016-11-01 VITALS — BP 135/84 | HR 66 | Temp 98.3°F | Resp 18 | Ht 76.0 in | Wt 253.2 lb

## 2016-11-01 DIAGNOSIS — Z79899 Other long term (current) drug therapy: Secondary | ICD-10-CM | POA: Insufficient documentation

## 2016-11-01 DIAGNOSIS — Z7982 Long term (current) use of aspirin: Secondary | ICD-10-CM | POA: Insufficient documentation

## 2016-11-01 DIAGNOSIS — I10 Essential (primary) hypertension: Secondary | ICD-10-CM | POA: Diagnosis not present

## 2016-11-01 DIAGNOSIS — E782 Mixed hyperlipidemia: Secondary | ICD-10-CM

## 2016-11-01 MED ORDER — LISINOPRIL 40 MG PO TABS: 40.0000 mg | ORAL_TABLET | Freq: Every day | ORAL | 1 refills | Status: DC

## 2016-11-01 MED ORDER — AMLODIPINE BESYLATE 10 MG PO TABS: 10.0000 mg | ORAL_TABLET | Freq: Every day | ORAL | 1 refills | Status: DC

## 2016-11-01 MED ORDER — HYDROCHLOROTHIAZIDE 12.5 MG PO TABS: 12.5000 mg | ORAL_TABLET | Freq: Every day | ORAL | 1 refills | Status: DC

## 2016-11-01 NOTE — Progress Notes (Signed)
Patient is here for f/up   Patient complains lower stomach pain heart burn

## 2016-11-01 NOTE — Patient Instructions (Signed)

## 2016-11-01 NOTE — Progress Notes (Deleted)
   Subjective:  Patient ID: Alexander Hunt, male    DOB: 08-18-1955  Age: 61 y.o. MRN: [id number]  CC: No chief complaint on file.   HPI Yasuo Offerdahl presents for ***  Outpatient Medications Prior to Visit  Medication Sig Dispense Refill  . amLODipine (NORVASC) 10 MG tablet Take 1 tablet (10 mg total) by mouth daily. 90 tablet 0  . aspirin EC 81 MG tablet Take 1 tablet (81 mg total) by mouth daily. 30 tablet 2  . atorvastatin (LIPITOR) 40 MG tablet Take 1 tablet (40 mg total) by mouth daily. 30 tablet 2  . lisinopril (PRINIVIL,ZESTRIL) 40 MG tablet Take 1 tablet (40 mg total) by mouth daily. 30 tablet 2  . tadalafil (CIALIS) 20 MG tablet Take 0.5-1 tablets (10-20 mg total) by mouth daily as needed for erectile dysfunction. 5 tablet 11  . traMADol (ULTRAM) 50 MG tablet Take 1 tablet (50 mg total) by mouth every 6 (six) hours as needed for pain. 15 tablet 0   Facility-Administered Medications Prior to Visit  Medication Dose Route Frequency Provider Last Rate Last Dose  . 0.9 %  sodium chloride infusion  500 mL Intravenous Continuous Iva Boop, MD        ROS Review of Systems  Review of Systems - {ros [id number]}    Objective:  BP 131/82 (BP Location: Left Arm, Patient Position: Sitting, Cuff Size: Normal)   Pulse 66   Temp 98.3 F (36.8 C) (Oral)   Resp 18   Ht  (1.93 m)   Wt 253 lb 3.2 oz (114.9 kg)   SpO2 98%   BMI 30.82 kg/m   BP/Weight 11/01/2016 08/14/2016 07/24/2016  Systolic BP 131 121 143  Diastolic BP 82 82 91  Wt. (Lbs) 253.2 - -  BMI 30.82 - -     Physical Exam   Assessment & Plan:   Problem List Items Addressed This Visit      Cardiovascular and Mediastinum   Hypertension - Primary     Other   Hyperlipidemia      No orders of the defined types were placed in this encounter.   Follow-up: No Follow-up on file.   Lizbeth Bark FNP

## 2016-11-02 LAB — LIPID PANEL
CHOL/HDL RATIO: 4.2 ratio (ref 0.0–5.0)
Cholesterol, Total: 209 mg/dL — ABNORMAL HIGH (ref 100–199)
HDL: 50 mg/dL (ref >39)
LDL CALC: 142 mg/dL — AB (ref 0–99)
TRIGLYCERIDES: 86 mg/dL (ref 0–149)
VLDL Cholesterol Cal: 17 mg/dL (ref 5–40)

## 2016-11-04 ENCOUNTER — Other Ambulatory Visit: Payer: Self-pay | Admitting: Family Medicine

## 2016-11-04 DIAGNOSIS — E782 Mixed hyperlipidemia: Secondary | ICD-10-CM

## 2016-11-04 MED ORDER — ATORVASTATIN CALCIUM 80 MG PO TABS: 80.0000 mg | ORAL_TABLET | Freq: Every day | ORAL | 2 refills | Status: DC

## 2016-11-05 ENCOUNTER — Telehealth: Payer: Self-pay

## 2016-11-05 NOTE — Progress Notes (Signed)
Subjective:  Patient ID: Alexander Hunt, male    DOB: 1955-08-17  Age: 61 y.o. MRN: [id number]  CC: Hypertension    HPI Alexander Hunt presents for hypertension/hyperlipidemia follow up.Marland Kitchen He is not exercising and is not adherent to low salt diet.   He does not check BP at home. well controlled at home. Cardiac symptoms none. Patient denies chest pain, chest pressure/discomfort, claudication, dyspnea, lower extremity edema, near-syncope and palpitations.  Cardiovascular risk factors: advanced age (older than 65 for men, 71 for women), dyslipidemia, hypertension, male gender, sedentary lifestyle and smoking/ tobacco exposure. Use of agents associated with hypertension: none. History of target organ damage: none.   Outpatient Medications Prior to Visit  Medication Sig Dispense Refill  . aspirin EC 81 MG tablet Take 1 tablet (81 mg total) by mouth daily. 30 tablet 2  . tadalafil (CIALIS) 20 MG tablet Take 0.5-1 tablets (10-20 mg total) by mouth daily as needed for erectile dysfunction. 5 tablet 11  . traMADol (ULTRAM) 50 MG tablet Take 1 tablet (50 mg total) by mouth every 6 (six) hours as needed for pain. 15 tablet 0  . amLODipine (NORVASC) 10 MG tablet Take 1 tablet (10 mg total) by mouth daily. 90 tablet 0  . atorvastatin (LIPITOR) 40 MG tablet Take 1 tablet (40 mg total) by mouth daily. 30 tablet 2  . lisinopril (PRINIVIL,ZESTRIL) 40 MG tablet Take 1 tablet (40 mg total) by mouth daily. 30 tablet 2   Facility-Administered Medications Prior to Visit  Medication Dose Route Frequency Provider Last Rate Last Dose  . 0.9 %  sodium chloride infusion  500 mL Intravenous Continuous Iva Boop, MD        ROS Review of Systems  Constitutional: Negative.   Eyes: Negative.   Respiratory: Negative.   Cardiovascular: Negative.   Gastrointestinal: Negative.   Musculoskeletal: Negative for myalgias.  Skin: Negative.   Neurological: Negative.   Psychiatric/Behavioral: Negative.      Objective:  BP 135/84   Pulse 66   Temp 98.3 F (36.8 C) (Oral)   Resp 18   Ht  (1.93 m)   Wt 253 lb 3.2 oz (114.9 kg)   SpO2 98%   BMI 30.82 kg/m   BP/Weight 11/01/2016 08/14/2016 07/24/2016  Systolic BP 135 121 143  Diastolic BP 84 82 91  Wt. (Lbs) 253.2 - -  BMI 30.82 - -     Physical Exam  Constitutional: He appears well-developed and well-nourished.  Eyes: Pupils are equal, round, and reactive to light. Conjunctivae are normal.  Neck: No JVD present.  Cardiovascular: Normal rate, regular rhythm, normal heart sounds and intact distal pulses.   Pulmonary/Chest: Effort normal and breath sounds normal.  Abdominal: Soft. Bowel sounds are normal.  Musculoskeletal: He exhibits no tenderness.  Skin: Skin is warm and dry.  Psychiatric: He has a normal mood and affect.  Nursing note and vitals reviewed.  Assessment & Plan:   1. Essential hypertension HCTZ added for better BP control. Schedule BP recheck in 2 weeks with nurse. If BP is greater than 90/60 (MAP 65 or greater) but not less than 130/80 may increase dose of HCTZ to 25 mg QD and recheck in another 2 weeks with nurse.  - hydrochlorothiazide (HYDRODIURIL) 12.5 MG tablet; Take 1 tablet (12.5 mg total) by mouth daily.  Dispense: 90 tablet; Refill: 1 - lisinopril (PRINIVIL,ZESTRIL) 40 MG tablet; Take 1 tablet (40 mg total) by mouth daily.  Dispense: 90 tablet; Refill: 1 - amLODipine (NORVASC) 10  MG tablet; Take 1 tablet (10 mg total) by mouth daily.  Dispense: 90 tablet; Refill: 1 - Lipid Panel  2. Mixed hyperlipidemia Current statin use, recheck cholesterol levels. - Lipid Panel   Meds ordered this encounter  Medications  . hydrochlorothiazide (HYDRODIURIL) 12.5 MG tablet    Sig: Take 1 tablet (12.5 mg total) by mouth daily.    Dispense:  90 tablet    Refill:  1    Order Specific Question:   Supervising Provider    Answer:   Quentin Angst [id number]  . lisinopril (PRINIVIL,ZESTRIL) 40 MG tablet     Sig: Take 1 tablet (40 mg total) by mouth daily.    Dispense:  90 tablet    Refill:  1    Order Specific Question:   Supervising Provider    Answer:   Quentin Angst [id number]  . amLODipine (NORVASC) 10 MG tablet    Sig: Take 1 tablet (10 mg total) by mouth daily.    Dispense:  90 tablet    Refill:  1    Order Specific Question:   Supervising Provider    Answer:   Quentin Angst [id number]    Follow up: Return in about 2 weeks (around 11/15/2016) for BP check with Travia.   Lizbeth Bark FNP

## 2016-11-05 NOTE — Telephone Encounter (Signed)
CMA call regarding lab results   Patient verify DOB  Patient was aware and understood  

## 2016-11-05 NOTE — Telephone Encounter (Signed)
-----   Message from Lizbeth Bark, FNP sent at 11/04/2016  6:56 PM EDT ----- Cholesterol levels have worsened since previous labs. This can increase your risk of heart disease overtime. Start eating a diet low in saturated fat. Limit your intake of fried foods, red meats, and whole milk. Increase activity. Recommend follow up in 3 months.

## 2016-11-20 ENCOUNTER — Ambulatory Visit: Payer: BLUE CROSS/BLUE SHIELD | Attending: Family Medicine | Admitting: *Deleted

## 2016-11-20 VITALS — BP 128/78 | HR 65

## 2016-11-20 DIAGNOSIS — I10 Essential (primary) hypertension: Secondary | ICD-10-CM | POA: Insufficient documentation

## 2016-11-20 NOTE — Progress Notes (Signed)
[facility] Pt alert and oriented and arrives in good spirits. Last OV 11/01/2016 with PCP.  Pt denies chest pain, SOB, HA, dizziness, or blurred vision.  Verified medication. Pt states medication was taken this morning.  Manual blood pressure reading: 128/78  Pt aware to continues to take medications as prescribed.

## 2017-01-31 ENCOUNTER — Ambulatory Visit: Payer: BLUE CROSS/BLUE SHIELD | Admitting: Family Medicine

## 2017-02-07 ENCOUNTER — Ambulatory Visit: Payer: BLUE CROSS/BLUE SHIELD | Admitting: Family Medicine

## 2017-02-13 ENCOUNTER — Ambulatory Visit: Payer: BLUE CROSS/BLUE SHIELD | Admitting: Family Medicine

## 2017-02-21 ENCOUNTER — Ambulatory Visit: Payer: BLUE CROSS/BLUE SHIELD | Admitting: Family Medicine

## 2017-09-26 ENCOUNTER — Ambulatory Visit: Payer: BLUE CROSS/BLUE SHIELD | Admitting: Family Medicine

## 2017-12-12 ENCOUNTER — Ambulatory Visit: Payer: BLUE CROSS/BLUE SHIELD | Admitting: Nurse Practitioner

## 2017-12-26 ENCOUNTER — Encounter: Payer: Self-pay | Admitting: Nurse Practitioner

## 2017-12-26 ENCOUNTER — Ambulatory Visit: Payer: BLUE CROSS/BLUE SHIELD | Attending: Nurse Practitioner | Admitting: Nurse Practitioner

## 2017-12-26 VITALS — BP 164/95 | HR 78 | Ht 76.0 in | Wt 254.0 lb

## 2017-12-26 DIAGNOSIS — Z7982 Long term (current) use of aspirin: Secondary | ICD-10-CM | POA: Insufficient documentation

## 2017-12-26 DIAGNOSIS — Z79899 Other long term (current) drug therapy: Secondary | ICD-10-CM | POA: Diagnosis not present

## 2017-12-26 DIAGNOSIS — I10 Essential (primary) hypertension: Secondary | ICD-10-CM

## 2017-12-26 DIAGNOSIS — R7303 Prediabetes: Secondary | ICD-10-CM

## 2017-12-26 DIAGNOSIS — E782 Mixed hyperlipidemia: Secondary | ICD-10-CM

## 2017-12-26 DIAGNOSIS — N529 Male erectile dysfunction, unspecified: Secondary | ICD-10-CM

## 2017-12-26 LAB — POCT GLYCOSYLATED HEMOGLOBIN (HGB A1C): HEMOGLOBIN A1C: 5.6 % (ref 4.0–5.6)

## 2017-12-26 LAB — GLUCOSE, POCT (MANUAL RESULT ENTRY): POC Glucose: 135 mg/dl — AB (ref 70–99)

## 2017-12-26 MED ORDER — DOCUSATE SODIUM 100 MG PO CAPS: 100.0000 mg | ORAL_CAPSULE | Freq: Two times a day (BID) | ORAL | 1 refills | Status: DC

## 2017-12-26 MED ORDER — ATORVASTATIN CALCIUM 80 MG PO TABS: 80.0000 mg | ORAL_TABLET | Freq: Every day | ORAL | 2 refills | Status: DC

## 2017-12-26 MED ORDER — AMLODIPINE BESYLATE 10 MG PO TABS: 10.0000 mg | ORAL_TABLET | Freq: Every day | ORAL | 1 refills | Status: DC

## 2017-12-26 MED ORDER — TADALAFIL 20 MG PO TABS: 10.0000 mg | ORAL_TABLET | Freq: Every day | ORAL | 11 refills | Status: DC | PRN

## 2017-12-26 MED ORDER — HYDROCHLOROTHIAZIDE 12.5 MG PO TABS: 12.5000 mg | ORAL_TABLET | Freq: Every day | ORAL | 1 refills | Status: DC

## 2017-12-26 MED ORDER — LISINOPRIL 40 MG PO TABS: 40.0000 mg | ORAL_TABLET | Freq: Every day | ORAL | 1 refills | Status: DC

## 2017-12-26 MED FILL — ATORVASTATIN 80 MG TABLET: 80 | 30 days supply | Qty: 30 | Fill #0

## 2017-12-26 MED FILL — AMLODIPINE BESYLATE 10 MG T: 10 | 30 days supply | Qty: 30 | Fill #0

## 2017-12-26 MED FILL — TADALAFIL 20 MG TABS: 20 | 30 days supply | Qty: 5 | Fill #0

## 2017-12-26 MED FILL — LISINOPRIL 40 MG TABLET: 40 | 30 days supply | Qty: 30 | Fill #0

## 2017-12-26 MED FILL — HYDROCHLOROTHIAZIDE 12.5 MG: 12.5 | 30 days supply | Qty: 30 | Fill #0

## 2017-12-26 NOTE — Progress Notes (Signed)
Assessment & Plan:  Alexander Hunt was seen today for establish care.  Diagnoses and all orders for this visit:  Essential hypertension -     amLODipine (NORVASC) 10 MG tablet; Take 1 tablet (10 mg total) by mouth daily. -     hydrochlorothiazide (HYDRODIURIL) 12.5 MG tablet; Take 1 tablet (12.5 mg total) by mouth daily. -     lisinopril (PRINIVIL,ZESTRIL) 40 MG tablet; Take 1 tablet (40 mg total) by mouth daily. -     CBC -     CMP14+EGFR  Prediabetes -     Glucose (CBG) -     HgB A1c  Mixed hyperlipidemia -     atorvastatin (LIPITOR) 80 MG tablet; Take 1 tablet (80 mg total) by mouth daily. -     Lipid panel  Erectile dysfunction, unspecified erectile dysfunction type -     tadalafil (ADCIRCA/CIALIS) 20 MG tablet; Take 0.5-1 tablets (10-20 mg total) by mouth daily as needed for erectile dysfunction.  Other orders -     docusate sodium (COLACE) 100 MG capsule; Take 1 capsule (100 mg total) by mouth 2 (two) times daily.    Patient has been counseled on age-appropriate routine health concerns for screening and prevention. These are reviewed and up-to-date. Referrals have been placed accordingly. Immunizations are up-to-date or declined.    Subjective:   Chief Complaint  Patient presents with  . Establish Care    Pt. is here to establish care for hypertension.    HPI Lomax Appelbaum 62 y.o. male presents to office today to reestablish care.  He has not been seen by a primary care provider in over 1 year.  He also has been out of his antihypertensives and statin for over a year.  He has a history of hypertension, mixed hyperlipidemia, and erectile dysfunction.  ESSENTIAL HYPERTENSION Blood pressure is not well-controlled today.  Previous antihypertensive regimen included amlodipine 10 mg daily, hydrochlorothiazide 12.5 mg daily, and lisinopril 40 mg daily.  Will refill all medications today and have patient follow-up for blood pressure recheck in a few weeks. Denies chest pain,  shortness of breath, palpitations, lightheadedness, dizziness, headaches or BLE edema.  BP Readings from Last 3 Encounters:  12/26/17 (!) 164/95  11/20/16 128/78  11/01/16 135/84   Hyperlipidemia Patient presents for follow up to hyperlipidemia.  He is not medication compliant. He is not diet compliant and denies skin xanthelasma or statin intolerance including myalgias.  LDL is not at goal.  Fasting lipid panel pending. Lab Results  Component Value Date   CHOL 209 (H) 11/01/2016   Lab Results  Component Value Date   HDL 50 11/01/2016   Lab Results  Component Value Date   LDLCALC 142 (H) 11/01/2016   Lab Results  Component Value Date   TRIG 86 11/01/2016   Lab Results  Component Value Date   CHOLHDL 4.2 11/01/2016    Erectile Dysfunction: Patient complains of erectile dysfunction.  Onset of dysfunction was a few years ago and was gradual in onset.  Patient states the nature of difficulty is both attaining and maintaining erection. Libido is not affected. Risk factors for ED include cardiovascular disease and antihypertensive medications, see above. Patient denies history of cranial, spinal, or pelvic trauma, pelvic radiation and hypogonadism. Patient's expectations as to sexual function are the ability to attain and maintain a full erection with the absence of premature ejaculation.  Patient's description of relationship w/partner: good.  Previous treatment of ED includes tadalafil which has provided positive results.  Review of Systems  Constitutional: Negative for fever, malaise/fatigue and weight loss.  HENT: Negative.  Negative for nosebleeds.   Eyes: Negative.  Negative for blurred vision, double vision and photophobia.  Respiratory: Negative.  Negative for cough and shortness of breath.   Cardiovascular: Negative.  Negative for chest pain, palpitations and leg swelling.  Gastrointestinal: Negative.  Negative for heartburn, nausea and vomiting.  Genitourinary:       SEE  HPI  Musculoskeletal: Negative.  Negative for myalgias.  Neurological: Negative.  Negative for dizziness, focal weakness, seizures and headaches.  Psychiatric/Behavioral: Negative.  Negative for suicidal ideas.    Past Medical History:  Diagnosis Date  . Hyperlipidemia   . Hypertension 03/13/2016    Past Surgical History:  Procedure Laterality Date  . NO PAST SURGERIES      Family History  Problem Relation Age of Onset  . Diabetes Maternal Grandmother   . Hypertension Maternal Grandmother   . Colon cancer Neg Hx   . Colon polyps Neg Hx   . Rectal cancer Neg Hx   . Stomach cancer Neg Hx   . Esophageal cancer Neg Hx     Social History Reviewed with no changes to be made today.   Outpatient Medications Prior to Visit  Medication Sig Dispense Refill  . aspirin EC 81 MG tablet Take 1 tablet (81 mg total) by mouth daily. 30 tablet 2  . lisinopril (PRINIVIL,ZESTRIL) 40 MG tablet Take 1 tablet (40 mg total) by mouth daily. 90 tablet 1  . traMADol (ULTRAM) 50 MG tablet Take 1 tablet (50 mg total) by mouth every 6 (six) hours as needed for pain. (Patient not taking: Reported on 12/26/2017) 15 tablet 0  . amLODipine (NORVASC) 10 MG tablet Take 1 tablet (10 mg total) by mouth daily. (Patient not taking: Reported on 12/26/2017) 90 tablet 1  . atorvastatin (LIPITOR) 80 MG tablet Take 1 tablet (80 mg total) by mouth daily. (Patient not taking: Reported on 12/26/2017) 30 tablet 2  . hydrochlorothiazide (HYDRODIURIL) 12.5 MG tablet Take 1 tablet (12.5 mg total) by mouth daily. (Patient not taking: Reported on 12/26/2017) 90 tablet 1  . tadalafil (CIALIS) 20 MG tablet Take 0.5-1 tablets (10-20 mg total) by mouth daily as needed for erectile dysfunction. (Patient not taking: Reported on 12/26/2017) 5 tablet 11   Facility-Administered Medications Prior to Visit  Medication Dose Route Frequency Provider Last Rate Last Dose  . 0.9 %  sodium chloride infusion  500 mL Intravenous Continuous  Gatha Mayer, MD        No Known Allergies     Objective:    BP (!) 164/95 (BP Location: Right Arm, Patient Position: Sitting, Cuff Size: Normal)   Pulse 78   Ht 6' 4"  (1.93 m)   Wt 254 lb (115.2 kg)   SpO2 97%   BMI 30.92 kg/m  Wt Readings from Last 3 Encounters:  12/26/17 254 lb (115.2 kg)  11/01/16 253 lb 3.2 oz (114.9 kg)  07/09/16 252 lb (114.3 kg)    Physical Exam  Constitutional: He is oriented to person, place, and time. He appears well-developed and well-nourished. He is cooperative.  HENT:  Head: Normocephalic and atraumatic.  Eyes: EOM are normal.  Neck: Normal range of motion.  Cardiovascular: Normal rate, regular rhythm and normal heart sounds. Exam reveals no gallop and no friction rub.  No murmur heard. Pulmonary/Chest: Effort normal and breath sounds normal. No tachypnea. No respiratory distress. He has no decreased breath sounds. He has no  wheezes. He has no rhonchi. He has no rales. He exhibits no tenderness.  Abdominal: Soft. Bowel sounds are normal.  Musculoskeletal: Normal range of motion. He exhibits no edema.  Neurological: He is alert and oriented to person, place, and time. Coordination normal.  Skin: Skin is warm and dry.  Psychiatric: He has a normal mood and affect. His behavior is normal. Judgment and thought content normal.  Nursing note and vitals reviewed.        Patient has been counseled extensively about nutrition and exercise as well as the importance of adherence with medications and regular follow-up. The patient was given clear instructions to go to ER or return to medical center if symptoms don't improve, worsen or new problems develop. The patient verbalized understanding.   Follow-up: Return for 4 weeks with luke bp recheck. 3 months with me HTN.   Gildardo Pounds, FNP-BC [facility] and [facility] [place], [place]   12/26/2017, 11:24 AM

## 2017-12-27 LAB — CMP14+EGFR
ALK PHOS: 92 IU/L (ref 39–117)
ALT: 30 IU/L (ref 0–44)
AST: 23 IU/L (ref 0–40)
Albumin/Globulin Ratio: 1.6 (ref 1.2–2.2)
Albumin: 5 g/dL — ABNORMAL HIGH (ref 3.6–4.8)
BILIRUBIN TOTAL: 0.6 mg/dL (ref 0.0–1.2)
BUN / CREAT RATIO: 9 — AB (ref 10–24)
BUN: 14 mg/dL (ref 8–27)
CO2: 25 mmol/L (ref 20–29)
Calcium: 10.6 mg/dL — ABNORMAL HIGH (ref 8.6–10.2)
Chloride: 97 mmol/L (ref 96–106)
Creatinine, Ser: 1.51 mg/dL — ABNORMAL HIGH (ref 0.76–1.27)
GFR calc non Af Amer: 49 mL/min/{1.73_m2} — ABNORMAL LOW (ref >59)
GFR, EST AFRICAN AMERICAN: 57 mL/min/{1.73_m2} — AB (ref >59)
GLOBULIN, TOTAL: 3.1 g/dL (ref 1.5–4.5)
Glucose: 109 mg/dL — ABNORMAL HIGH (ref 65–99)
POTASSIUM: 5.1 mmol/L (ref 3.5–5.2)
SODIUM: 137 mmol/L (ref 134–144)
TOTAL PROTEIN: 8.1 g/dL (ref 6.0–8.5)

## 2017-12-27 LAB — CBC
HEMOGLOBIN: 14.2 g/dL (ref 13.0–17.7)
Hematocrit: 43.3 % (ref 37.5–51.0)
MCH: 20.5 pg — AB (ref 26.6–33.0)
MCHC: 32.8 g/dL (ref 31.5–35.7)
MCV: 63 fL — AB (ref 79–97)
Platelets: 276 10*3/uL (ref 150–450)
RBC: 6.91 x10E6/uL — AB (ref 4.14–5.80)
RDW: 18.2 % — ABNORMAL HIGH (ref 12.3–15.4)
WBC: 7.4 10*3/uL (ref 3.4–10.8)

## 2017-12-27 LAB — LIPID PANEL
CHOL/HDL RATIO: 4.8 ratio (ref 0.0–5.0)
Cholesterol, Total: 211 mg/dL — ABNORMAL HIGH (ref 100–199)
HDL: 44 mg/dL (ref >39)
LDL CALC: 149 mg/dL — AB (ref 0–99)
Triglycerides: 90 mg/dL (ref 0–149)
VLDL Cholesterol Cal: 18 mg/dL (ref 5–40)

## 2017-12-30 ENCOUNTER — Other Ambulatory Visit: Payer: Self-pay | Admitting: Nurse Practitioner

## 2017-12-30 DIAGNOSIS — R7989 Other specified abnormal findings of blood chemistry: Secondary | ICD-10-CM

## 2018-01-07 ENCOUNTER — Telehealth: Payer: Self-pay

## 2018-01-07 NOTE — Telephone Encounter (Signed)
CMA attempt to reach patient to inform on results.  No answer and left a VM for patient to call back.  If patient call back, please inform:  Kidney function has slightly worsened. Your red blood cells also show some abnormalities that warrant further evaluatio which may also cause the abnormality with your kidney levels. I am placing a referral to a blood specialist for further evaluation. They will call you to schedule an appointment. Cholesterol levels are still elevated. Make sure you are taking your cholesterol medication atorvastatin as prescribed.  A letter will be send out to reach patient.

## 2018-01-07 NOTE — Telephone Encounter (Signed)
-----   Message from Claiborne RiggZelda W Fleming, NP sent at 12/30/2017 11:10 PM EST ----- Kidney function has slightly worsened. Your red blood cells also show some abnormalities that warrant further evaluatio which may also cause the abnormality with your kidney levels. I am placing a referral to a blood specialist for further evaluation. They will call you to schedule an appointment. Cholesterol levels are still elevated. Make sure you are taking your cholesterol medication atorvastatin as prescribed.

## 2018-01-09 ENCOUNTER — Telehealth: Payer: Self-pay | Admitting: Nurse Practitioner

## 2018-01-09 NOTE — Telephone Encounter (Signed)
Patient called for labs and RMA Jay'a spoke to patient he didn't have any question.

## 2018-01-12 ENCOUNTER — Encounter: Payer: Self-pay | Admitting: Hematology

## 2018-01-12 ENCOUNTER — Telehealth: Payer: Self-pay | Admitting: Hematology

## 2018-01-12 NOTE — Telephone Encounter (Signed)
Received a hem referral from Dr. Meredeth IdeFleming for abnl cbc. Pt has been cld and scheduled to see Dr. Candise CheKale on 12/26 at 10am. Pt aware to arrive 30 minutes early. Letter mailed.

## 2018-01-23 ENCOUNTER — Ambulatory Visit: Payer: BLUE CROSS/BLUE SHIELD | Attending: Nurse Practitioner | Admitting: Pharmacist

## 2018-01-23 VITALS — BP 127/78 | HR 73

## 2018-01-23 DIAGNOSIS — I10 Essential (primary) hypertension: Secondary | ICD-10-CM | POA: Insufficient documentation

## 2018-01-23 DIAGNOSIS — F17200 Nicotine dependence, unspecified, uncomplicated: Secondary | ICD-10-CM | POA: Insufficient documentation

## 2018-01-23 DIAGNOSIS — Z79899 Other long term (current) drug therapy: Secondary | ICD-10-CM | POA: Insufficient documentation

## 2018-01-23 MED FILL — ATORVASTATIN 80 MG TABLET: 80 | 30 days supply | Qty: 30 | Fill #1

## 2018-01-23 MED FILL — LISINOPRIL 40 MG TABLET: 40 | 30 days supply | Qty: 30 | Fill #1

## 2018-01-23 MED FILL — AMLODIPINE BESYLATE 10 MG T: 10 | 30 days supply | Qty: 30 | Fill #1

## 2018-01-23 MED FILL — HYDROCHLOROTHIAZIDE 12.5 MG: 12.5 | 30 days supply | Qty: 30 | Fill #1

## 2018-01-23 NOTE — Patient Instructions (Signed)

## 2018-01-23 NOTE — Progress Notes (Signed)
   S:   PCP: Zelda   Patient arrives in good spirits. Presents to the clinic for hypertension management. Patient was referred by Zelda on 12/26/2017. BP was elevated at that visit but pt had been without BP medications. Refills were given and no changes to medications were made.   Patient reports adherence with medications. Denies chest pain, dizziness, shortness of breath, or blurred vision. Denies BLE edema.   Current BP Medications include:  Amlodipine 10 mg daily, HCTZ 12.5 mg daily, lisinopril 40 mg daily  Dietary habits include: does not limit salt, denies drinking caffeine  Exercise habits include:does not exercise outside of work; "i'm on my feet a lot at work" Family / Social history: some day smoker, drinks 2-3 liquor-based mixed drinks on the weekend  Home BP readings: does not check at home   O:  L arm after 5 minutes rest: 128/78, HR 73 Last 3 Office BP readings: BP Readings from Last 3 Encounters:  12/26/17 (!) 164/95  11/20/16 128/78  11/01/16 135/84   BMET    Component Value Date/Time   NA 137 12/26/2017 1200   K 5.1 12/26/2017 1200   CL 97 12/26/2017 1200   CO2 25 12/26/2017 1200   GLUCOSE 109 (H) 12/26/2017 1200   GLUCOSE 115 (H) 04/24/2016 0949   BUN 14 12/26/2017 1200   CREATININE 1.51 (H) 12/26/2017 1200   CREATININE 1.55 (H) 04/24/2016 0949   CALCIUM 10.6 (H) 12/26/2017 1200   GFRNONAA 49 (L) 12/26/2017 1200   GFRNONAA 46 (L) 03/13/2016 1105   GFRAA 57 (L) 12/26/2017 1200   GFRAA 54 (L) 03/13/2016 1105    Renal function: CrCl cannot be calculated (Patient's most recent lab result is older than the maximum 21 days allowed.).  Clinical ASCVD: No  The 10-year ASCVD risk score Denman George(Goff DC Jr., et al., 2013) is: 35.7%   Values used to calculate the score:     Age: [age] years     Sex: Male     Is Non-Hispanic African American: Yes     Diabetic: No     Tobacco smoker: Yes     Systolic Blood Pressure: 164 mmHg     Is BP treated: Yes     HDL  Cholesterol: 44 mg/dL     Total Cholesterol: 211 mg/dL  A/P: Hypertension longstanding currently controlled on current medications. BP Goal <130/80 mmHg. Patient is adherent with current medications. Sent in refills as he has 2 days left.  -Continued current anti-hypertensive medications.  -Counseled on lifestyle modifications for blood pressure control including reduced dietary sodium, increased exercise, adequate sleep  Results reviewed and written information provided. Total time in face-to-face counseling 15 minutes.   F/U Clinic Visit w/ PCP on 03/27/2018.    Butch PennyLuke Van Ausdall, PharmD, CPP Clinical Pharmacist [facility] [phone]

## 2018-01-26 ENCOUNTER — Encounter: Payer: Self-pay | Admitting: Pharmacist

## 2018-01-29 ENCOUNTER — Inpatient Hospital Stay: Payer: BLUE CROSS/BLUE SHIELD | Attending: Hematology | Admitting: Hematology

## 2018-02-09 MED FILL — ATORVASTATIN 80 MG TABLET: 80 | 30 days supply | Qty: 30 | Fill #1

## 2018-02-09 MED FILL — AMLODIPINE BESYLATE 10 MG T: 10 | 30 days supply | Qty: 30 | Fill #1

## 2018-02-09 MED FILL — LISINOPRIL 40 MG TABLET: 40 | 30 days supply | Qty: 30 | Fill #1

## 2018-02-09 MED FILL — HYDROCHLOROTHIAZIDE 12.5 MG: 12.5 | 30 days supply | Qty: 30 | Fill #1

## 2018-02-09 MED FILL — TADALAFIL 20 MG TABS: 20 | 30 days supply | Qty: 5 | Fill #1

## 2018-03-27 ENCOUNTER — Ambulatory Visit: Payer: BLUE CROSS/BLUE SHIELD | Admitting: Nurse Practitioner

## 2018-03-31 ENCOUNTER — Ambulatory Visit: Payer: BLUE CROSS/BLUE SHIELD | Attending: Nurse Practitioner | Admitting: Nurse Practitioner

## 2018-03-31 ENCOUNTER — Encounter: Payer: Self-pay | Admitting: Nurse Practitioner

## 2018-03-31 VITALS — BP 124/74 | HR 68 | Temp 98.5°F | Ht 76.0 in | Wt 252.4 lb

## 2018-03-31 DIAGNOSIS — K219 Gastro-esophageal reflux disease without esophagitis: Secondary | ICD-10-CM

## 2018-03-31 DIAGNOSIS — I10 Essential (primary) hypertension: Secondary | ICD-10-CM

## 2018-03-31 DIAGNOSIS — R7989 Other specified abnormal findings of blood chemistry: Secondary | ICD-10-CM | POA: Diagnosis not present

## 2018-03-31 MED ORDER — OMEPRAZOLE 20 MG PO CPDR: 20.0000 mg | DELAYED_RELEASE_CAPSULE | Freq: Every day | ORAL | 3 refills | Status: DC

## 2018-03-31 MED FILL — OMEPRAZOLE 20 MG CAP: 20 | 30 days supply | Qty: 30 | Fill #0

## 2018-03-31 NOTE — Progress Notes (Signed)
Assessment & Plan:  There are no diagnoses linked to this encounter.  Patient has been counseled on age-appropriate routine health concerns for screening and prevention. These are reviewed and up-to-date. Referrals have been placed accordingly. Immunizations are up-to-date or declined.    Subjective:   Chief Complaint  Patient presents with  . Follow-up    Pt is here for Hypertension follow-up. Pt. stated his stomach hurts and afterward his chest will hurt.    HPI Alexander Hunt 63 y.o. male presents to office today for HTN.  He also has complaints of GERD symptoms today.   Essential Hypertension Chronic and well controlled. Last Cr+ was trending up. Will repeat CMP today . May need to dc HCTZ based on lab results. He endorses medication compliance taking HCTZ 12.5mg , amlodipine 10 mg and lisinopril 40mg  daily. Denies chest pain, shortness of breath, palpitations, lightheadedness, dizziness, headaches or BLE edema.  BP Readings from Last 3 Encounters:  03/31/18 124/74  01/23/18 127/78  12/26/17 (!) 164/95    GERD: Patient complains of heartburn. This has been associated with mid epigastric pain and abdominal pain.  He denies bilious reflux, deep pressure at base of neck, difficulty swallowing, hematemesis, melena, need to clear throat frequently and regurgitation of undigested food. Symptoms have been present for several months. He denies dysphagia.  He has not lost weight. He denies melena, hematochezia, hematemesis, and coffee ground emesis. Medical therapy in the past has included antacids (TUMS with minimal relief).    Review of Systems  Constitutional: Negative for fever, malaise/fatigue and weight loss.  HENT: Negative.  Negative for nosebleeds.   Eyes: Negative.  Negative for blurred vision, double vision and photophobia.  Respiratory: Negative.  Negative for cough and shortness of breath.   Cardiovascular: Positive for chest pain (mid epigastric). Negative for  palpitations and leg swelling.  Gastrointestinal: Positive for abdominal pain. Negative for heartburn, nausea and vomiting.  Musculoskeletal: Negative.  Negative for myalgias.  Neurological: Negative.  Negative for dizziness, focal weakness, seizures and headaches.  Psychiatric/Behavioral: Negative.  Negative for suicidal ideas.    Past Medical History:  Diagnosis Date  . Hyperlipidemia   . Hypertension 03/13/2016    Past Surgical History:  Procedure Laterality Date  . NO PAST SURGERIES      Family History  Problem Relation Age of Onset  . Diabetes Maternal Grandmother   . Hypertension Maternal Grandmother   . Colon cancer Neg Hx   . Colon polyps Neg Hx   . Rectal cancer Neg Hx   . Stomach cancer Neg Hx   . Esophageal cancer Neg Hx     Social History Reviewed with no changes to be made today.   Outpatient Medications Prior to Visit  Medication Sig Dispense Refill  . amLODipine (NORVASC) 10 MG tablet Take 1 tablet (10 mg total) by mouth daily. 90 tablet 1  . atorvastatin (LIPITOR) 80 MG tablet Take 1 tablet (80 mg total) by mouth daily. 90 tablet 2  . hydrochlorothiazide (HYDRODIURIL) 12.5 MG tablet Take 1 tablet (12.5 mg total) by mouth daily. 90 tablet 1  . lisinopril (PRINIVIL,ZESTRIL) 40 MG tablet Take 1 tablet (40 mg total) by mouth daily. 90 tablet 1  . tadalafil (ADCIRCA/CIALIS) 20 MG tablet Take 0.5-1 tablets (10-20 mg total) by mouth daily as needed for erectile dysfunction. 5 tablet 11  . aspirin EC 81 MG tablet Take 1 tablet (81 mg total) by mouth daily. (Patient not taking: Reported on 03/31/2018) 30 tablet 2  . docusate sodium (  COLACE) 100 MG capsule Take 1 capsule (100 mg total) by mouth 2 (two) times daily. (Patient not taking: Reported on 03/31/2018) 30 capsule 1   Facility-Administered Medications Prior to Visit  Medication Dose Route Frequency Provider Last Rate Last Dose  . 0.9 %  sodium chloride infusion  500 mL Intravenous Continuous Iva Boop, MD         No Known Allergies     Objective:    BP 124/74 (BP Location: Left Arm, Patient Position: Sitting, Cuff Size: Large)   Pulse 68   Temp 98.5 F (36.9 C) (Oral)   Ht 6\' 4"  (1.93 m)   Wt 252 lb 6.4 oz (114.5 kg)   SpO2 98%   BMI 30.72 kg/m  Wt Readings from Last 3 Encounters:  03/31/18 252 lb 6.4 oz (114.5 kg)  12/26/17 254 lb (115.2 kg)  11/01/16 253 lb 3.2 oz (114.9 kg)    Physical Exam Vitals signs and nursing note reviewed.  Constitutional:      Appearance: He is well-developed.  HENT:     Head: Normocephalic and atraumatic.  Neck:     Musculoskeletal: Normal range of motion.  Cardiovascular:     Rate and Rhythm: Normal rate and regular rhythm.     Heart sounds: Normal heart sounds. No murmur. No friction rub. No gallop.   Pulmonary:     Effort: Pulmonary effort is normal. No tachypnea or respiratory distress.     Breath sounds: Normal breath sounds. No decreased breath sounds, wheezing, rhonchi or rales.  Chest:     Chest wall: No tenderness.  Abdominal:     General: Bowel sounds are normal.     Palpations: Abdomen is soft.     Tenderness: There is no abdominal tenderness.     Hernia: No hernia is present.  Musculoskeletal: Normal range of motion.  Skin:    General: Skin is warm and dry.  Neurological:     Mental Status: He is alert and oriented to person, place, and time.     Coordination: Coordination normal.  Psychiatric:        Behavior: Behavior normal. Behavior is cooperative.        Thought Content: Thought content normal.        Judgment: Judgment normal.        Patient has been counseled extensively about nutrition and exercise as well as the importance of adherence with medications and regular follow-up. The patient was given clear instructions to go to ER or return to medical center if symptoms don't improve, worsen or new problems develop. The patient verbalized understanding.   Follow-up: No follow-ups on file.   Claiborne Rigg,  FNP-BC [facility] and [facility] [place], [place] [phone]   03/31/2018, 9:14 AM

## 2018-03-31 NOTE — Patient Instructions (Signed)
Food Choices for Gastroesophageal Reflux Disease, Adult When you have gastroesophageal reflux disease (GERD), the foods you eat and your eating habits are very important. Choosing the right foods can help ease your discomfort. Think about working with a nutrition specialist (dietitian) to help you make good choices. What are tips for following this plan?  Meals  Choose healthy foods that are low in fat, such as fruits, vegetables, whole grains, low-fat dairy products, and lean meat, fish, and poultry.  Eat small meals often instead of 3 large meals a day. Eat your meals slowly, and in a place where you are relaxed. Avoid bending over or lying down until 2-3 hours after eating.  Avoid eating meals 2-3 hours before bed.  Avoid drinking a lot of liquid with meals.  Cook foods using methods other than frying. Bake, grill, or broil food instead.  Avoid or limit: ? Chocolate. ? Peppermint or spearmint. ? Alcohol. ? Pepper. ? Black and decaffeinated coffee. ? Black and decaffeinated tea. ? Bubbly (carbonated) soft drinks. ? Caffeinated energy drinks and soft drinks.  Limit high-fat foods such as: ? Fatty meat or fried foods. ? Whole milk, cream, butter, or ice cream. ? Nuts and nut butters. ? Pastries, donuts, and sweets made with butter or shortening.  Avoid foods that cause symptoms. These foods may be different for everyone. Common foods that cause symptoms include: ? Tomatoes. ? Oranges, lemons, and limes. ? Peppers. ? Spicy food. ? Onions and garlic. ? Vinegar. Lifestyle  Maintain a healthy weight. Ask your doctor what weight is healthy for you. If you need to lose weight, work with your doctor to do so safely.  Exercise for at least 30 minutes for 5 or more days each week, or as told by your doctor.  Wear loose-fitting clothes.  Do not smoke. If you need help quitting, ask your doctor.  Sleep with the head of your bed higher than your feet. Use a wedge under the  mattress or blocks under the bed frame to raise the head of the bed. Summary  When you have gastroesophageal reflux disease (GERD), food and lifestyle choices are very important in easing your symptoms.  Eat small meals often instead of 3 large meals a day. Eat your meals slowly, and in a place where you are relaxed.  Limit high-fat foods such as fatty meat or fried foods.  Avoid bending over or lying down until 2-3 hours after eating.  Avoid peppermint and spearmint, caffeine, alcohol, and chocolate. This information is not intended to replace advice given to you by your health care provider. Make sure you discuss any questions you have with your health care provider. Document Released: 07/23/2011 Document Revised: 02/27/2016 Document Reviewed: 02/27/2016 Elsevier Interactive Patient Education  2019 [place].  Gastroesophageal Reflux Disease, Adult Gastroesophageal reflux (GER) happens when acid from the stomach flows up into the tube that connects the mouth and the stomach (esophagus). Normally, food travels down the esophagus and stays in the stomach to be digested. With GER, food and stomach acid sometimes move back up into the esophagus. You may have a disease called gastroesophageal reflux disease (GERD) if the reflux:  Happens often.  Causes frequent or very bad symptoms.  Causes problems such as damage to the esophagus. When this happens, the esophagus becomes sore and swollen (inflamed). Over time, GERD can make small holes (ulcers) in the lining of the esophagus. What are the causes? This condition is caused by a problem with the muscle between the  esophagus and the stomach. When this muscle is weak or not normal, it does not close properly to keep food and acid from coming back up from the stomach. The muscle can be weak because of:  Tobacco use.  Pregnancy.  Having a certain type of hernia (hiatal hernia).  Alcohol use.  Certain foods and drinks, such as coffee,  chocolate, onions, and peppermint. What increases the risk? You are more likely to develop this condition if you:  Are overweight.  Have a disease that affects your connective tissue.  Use NSAID medicines. What are the signs or symptoms? Symptoms of this condition include:  Heartburn.  Difficult or painful swallowing.  The feeling of having a lump in the throat.  A bitter taste in the mouth.  Bad breath.  Having a lot of saliva.  Having an upset or bloated stomach.  Belching.  Chest pain. Different conditions can cause chest pain. Make sure you see your doctor if you have chest pain.  Shortness of breath or noisy breathing (wheezing).  Ongoing (chronic) cough or a cough at night.  Wearing away of the surface of teeth (tooth enamel).  Weight loss. How is this treated? Treatment will depend on how bad your symptoms are. Your doctor may suggest:  Changes to your diet.  Medicine.  Surgery. Follow these instructions at home: Eating and drinking   Follow a diet as told by your doctor. You may need to avoid foods and drinks such as: ? Coffee and tea (with or without caffeine). ? Drinks that contain alcohol. ? Energy drinks and sports drinks. ? Bubbly (carbonated) drinks or sodas. ? Chocolate and cocoa. ? Peppermint and mint flavorings. ? Garlic and onions. ? Horseradish. ? Spicy and acidic foods. These include peppers, chili powder, curry powder, vinegar, hot sauces, and BBQ sauce. ? Citrus fruit juices and citrus fruits, such as oranges, lemons, and limes. ? Tomato-based foods. These include red sauce, chili, salsa, and pizza with red sauce. ? Fried and fatty foods. These include donuts, french fries, potato chips, and high-fat dressings. ? High-fat meats. These include hot dogs, rib eye steak, sausage, ham, and bacon. ? High-fat dairy items, such as whole milk, butter, and cream cheese.  Eat small meals often. Avoid eating large meals.  Avoid drinking  large amounts of liquid with your meals.  Avoid eating meals during the 2-3 hours before bedtime.  Avoid lying down right after you eat.  Do not exercise right after you eat. Lifestyle   Do not use any products that contain nicotine or tobacco. These include cigarettes, e-cigarettes, and chewing tobacco. If you need help quitting, ask your doctor.  Try to lower your stress. If you need help doing this, ask your doctor.  If you are overweight, lose an amount of weight that is healthy for you. Ask your doctor about a safe weight loss goal. General instructions  Pay attention to any changes in your symptoms.  Take over-the-counter and prescription medicines only as told by your doctor. Do not take aspirin, ibuprofen, or other NSAIDs unless your doctor says it is okay.  Wear loose clothes. Do not wear anything tight around your waist.  Raise (elevate) the head of your bed about 6 inches (15 cm).  Avoid bending over if this makes your symptoms worse.  Keep all follow-up visits as told by your doctor. This is important. Contact a doctor if:  You have new symptoms.  You lose weight and you do not know why.  You have  trouble swallowing or it hurts to swallow.  You have wheezing or a cough that keeps happening.  Your symptoms do not get better with treatment.  You have a hoarse voice. Get help right away if:  You have pain in your arms, neck, jaw, teeth, or back.  You feel sweaty, dizzy, or light-headed.  You have chest pain or shortness of breath.  You throw up (vomit) and your throw-up looks like blood or coffee grounds.  You pass out (faint).  Your poop (stool) is bloody or black.  You cannot swallow, drink, or eat. Summary  If a person has gastroesophageal reflux disease (GERD), food and stomach acid move back up into the esophagus and cause symptoms or problems such as damage to the esophagus.  Treatment will depend on how bad your symptoms are.  Follow a  diet as told by your doctor.  Take all medicines only as told by your doctor. This information is not intended to replace advice given to you by your health care provider. Make sure you discuss any questions you have with your health care provider. Document Released: 07/10/2007 Document Revised: 07/30/2017 Document Reviewed: 07/30/2017 Elsevier Interactive Patient Education  2019 [employer]  Heartburn Heartburn is a type of pain or discomfort that can happen in the throat or chest. It is often described as a burning pain. It may also cause a bad, acid-like taste in the mouth. Heartburn may feel worse when you lie down or bend over. It may be worse at night. It may be caused by stomach contents that move back up (reflux) into the tube that connects the mouth with the stomach (esophagus). Follow these instructions at home: Eating and drinking   Avoid certain foods and drinks as told by your doctor. This may include: ? Coffee and tea (with or without caffeine). ? Drinks that have alcohol. ? Energy drinks and sports drinks. ? Carbonated drinks or sodas. ? Chocolate and cocoa. ? Peppermint and mint flavorings. ? Garlic and onions. ? Horseradish. ? Spicy and acidic foods, such as:  Peppers.  Chili powder and curry powder.  Vinegar.  Hot sauces and BBQ sauce. ? Citrus fruit juices and citrus fruits, such as:  Oranges.  Lemons.  Limes. ? Tomato-based foods, such as:  Red sauce and pizza with red sauce.  Chili.  Salsa. ? Fried and fatty foods, such as:  Donuts.  [place] fries and potato chips.  High-fat dressings. ? High-fat meats, such as:  Hot dogs and sausage.  Rib eye steak.  Ham and bacon. ? High-fat dairy items, such as:  Whole milk.  Butter.  Cream cheese.  Eat small meals often. Avoid eating large meals.  Avoid drinking large amounts of liquid with your meals.  Avoid eating meals during the 2-3 hours before bedtime.  Avoid lying down right  after you eat.  Do not exercise right after you eat. Lifestyle      If you are overweight, lose an amount of weight that is healthy for you. Ask your doctor about a safe weight loss goal.  Do not use any products that contain nicotine or tobacco, including cigarettes, e-cigarettes, and chewing tobacco. These can make your symptoms worse. If you need help quitting, ask your doctor.  Wear loose clothes. Do not wear anything tight around your waist.  Raise (elevate) the head of your bed about 6 inches (15 cm) when you sleep.  Try to lower your stress. If you need help doing this, ask your doctor. General  instructions  Pay attention to any changes in your symptoms.  Take over-the-counter and prescription medicines only as told by your doctor. ? Do not take aspirin, ibuprofen, or other NSAIDs unless your doctor says it is okay. ? Stop medicines only as told by your doctor.  Keep all follow-up visits as told by your doctor. This is important. Contact a doctor if:  You have new symptoms.  You lose weight and you do not know why it is happening.  You have trouble swallowing, or it hurts to swallow.  You have wheezing or a cough that keeps happening.  Your symptoms do not get better with treatment.  You have heartburn often for more than 2 weeks. Get help right away if:  You have pain in your arms, neck, jaw, teeth, or back.  You feel sweaty, dizzy, or light-headed.  You have chest pain or shortness of breath.  You throw up (vomit) and your throw up looks like blood or coffee grounds.  Your poop (stool) is bloody or black. These symptoms may represent a serious problem that is an emergency. Do not wait to see if the symptoms will go away. Get medical help right away. Call your local emergency services (911 in the [place] Do not drive yourself to the hospital. Summary  Heartburn is a type of pain that can happen in the throat or chest. It can feel like a burning pain. It may  also cause a bad, acid-like taste in the mouth.  You may need to avoid certain foods and drinks to help your symptoms. Ask your doctor what foods and drinks you should avoid.  Take over-the-counter and prescription medicines only as told by your doctor. Do not take aspirin, ibuprofen, or other NSAIDs unless your doctor told you to do so.  Contact your doctor if your symptoms do not get better or they get worse. This information is not intended to replace advice given to you by your health care provider. Make sure you discuss any questions you have with your health care provider. Document Released: 10/03/2010 Document Revised: 06/23/2017 Document Reviewed: 06/23/2017 Elsevier Interactive Patient Education  2019 [employer].

## 2018-04-01 LAB — CMP14+EGFR
ALT: 25 IU/L (ref 0–44)
AST: 19 IU/L (ref 0–40)
Albumin/Globulin Ratio: 1.8 (ref 1.2–2.2)
Albumin: 4.9 g/dL — ABNORMAL HIGH (ref 3.8–4.8)
Alkaline Phosphatase: 106 IU/L (ref 39–117)
BILIRUBIN TOTAL: 0.6 mg/dL (ref 0.0–1.2)
BUN/Creatinine Ratio: 9 — ABNORMAL LOW (ref 10–24)
BUN: 13 mg/dL (ref 8–27)
CO2: 23 mmol/L (ref 20–29)
Calcium: 10.1 mg/dL (ref 8.6–10.2)
Chloride: 103 mmol/L (ref 96–106)
Creatinine, Ser: 1.48 mg/dL — ABNORMAL HIGH (ref 0.76–1.27)
GFR calc Af Amer: 58 mL/min/{1.73_m2} — ABNORMAL LOW (ref >59)
GFR calc non Af Amer: 50 mL/min/{1.73_m2} — ABNORMAL LOW (ref >59)
Globulin, Total: 2.7 g/dL (ref 1.5–4.5)
Glucose: 103 mg/dL — ABNORMAL HIGH (ref 65–99)
Potassium: 5 mmol/L (ref 3.5–5.2)
Sodium: 142 mmol/L (ref 134–144)
Total Protein: 7.6 g/dL (ref 6.0–8.5)

## 2018-04-02 ENCOUNTER — Telehealth: Payer: Self-pay

## 2018-04-02 NOTE — Telephone Encounter (Signed)
CMA spoke to patient to inform to stop taking the HCTZ and to keep his upcoming appt.  Pt. Verified DOB. Pt. Understood.

## 2018-04-02 NOTE — Telephone Encounter (Signed)
CMA attempt to reach patient to inform on results.  No answer and left a VM for patient.  

## 2018-04-02 NOTE — Telephone Encounter (Signed)
-----   Message from Claiborne Rigg, NP sent at 04/02/2018 12:10 AM EST ----- Creatinine is still slightly elevated. At this time I would recommend stopping the hctz 12.5mg . Make sure you follow up for your appt in April with me so that we can recheck your labs and blood pressure

## 2018-05-13 ENCOUNTER — Encounter: Payer: Self-pay | Admitting: Nurse Practitioner

## 2018-05-13 ENCOUNTER — Ambulatory Visit: Payer: BLUE CROSS/BLUE SHIELD | Attending: Nurse Practitioner | Admitting: Nurse Practitioner

## 2018-05-13 ENCOUNTER — Other Ambulatory Visit: Payer: Self-pay

## 2018-05-13 ENCOUNTER — Ambulatory Visit: Payer: BLUE CROSS/BLUE SHIELD | Admitting: Nurse Practitioner

## 2018-05-13 DIAGNOSIS — N529 Male erectile dysfunction, unspecified: Secondary | ICD-10-CM | POA: Diagnosis not present

## 2018-05-13 DIAGNOSIS — I10 Essential (primary) hypertension: Secondary | ICD-10-CM | POA: Diagnosis not present

## 2018-05-13 DIAGNOSIS — E782 Mixed hyperlipidemia: Secondary | ICD-10-CM

## 2018-05-13 DIAGNOSIS — K219 Gastro-esophageal reflux disease without esophagitis: Secondary | ICD-10-CM

## 2018-05-13 DIAGNOSIS — R7989 Other specified abnormal findings of blood chemistry: Secondary | ICD-10-CM

## 2018-05-13 MED ORDER — AMLODIPINE BESYLATE 10 MG PO TABS: 10.0000 mg | ORAL_TABLET | Freq: Every day | ORAL | 1 refills | Status: DC

## 2018-05-13 MED ORDER — LISINOPRIL 40 MG PO TABS: 40.0000 mg | ORAL_TABLET | Freq: Every day | ORAL | 1 refills | Status: DC

## 2018-05-13 MED ORDER — OMEPRAZOLE 20 MG PO CPDR: 20.0000 mg | DELAYED_RELEASE_CAPSULE | Freq: Every day | ORAL | 0 refills | Status: DC

## 2018-05-13 MED ORDER — ATORVASTATIN CALCIUM 80 MG PO TABS: 80.0000 mg | ORAL_TABLET | Freq: Every day | ORAL | 2 refills | Status: DC

## 2018-05-13 MED ORDER — TADALAFIL 20 MG PO TABS: 10.0000 mg | ORAL_TABLET | Freq: Every day | ORAL | 11 refills | Status: DC | PRN

## 2018-05-13 NOTE — Progress Notes (Signed)
Assessment & Plan:  Alexander Hunt was seen today for follow-up.  Diagnoses and all orders for this visit:  Essential hypertension -     amLODipine (NORVASC) 10 MG tablet; Take 1 tablet (10 mg total) by mouth daily. -     lisinopril (PRINIVIL,ZESTRIL) 40 MG tablet; Take 1 tablet (40 mg total) by mouth daily. -     CMP14+EGFR Continue all antihypertensives as prescribed.  Remember to bring in your blood pressure log with you for your follow up appointment.  DASH/Mediterranean Diets are healthier choices for HTN.    Mixed hyperlipidemia -     atorvastatin (LIPITOR) 80 MG tablet; Take 1 tablet (80 mg total) by mouth daily. INSTRUCTIONS: Work on a low fat, heart healthy diet and participate in regular aerobic exercise program by working out at least 150 minutes per week; 5 days a week-30 minutes per day. Avoid red meat, fried foods. junk foods, sodas, sugary drinks, unhealthy snacking, alcohol and smoking.  Drink at least 48oz of water per day and monitor your carbohydrate intake daily.    Erectile dysfunction, unspecified erectile dysfunction type -     tadalafil (ADCIRCA/CIALIS) 20 MG tablet; Take 0.5-1 tablets (10-20 mg total) by mouth daily as needed for erectile dysfunction.  Elevated serum creatinine -     CMP14+EGFR  Gastroesophageal reflux disease, esophagitis presence not specified -     omeprazole (PRILOSEC) 20 MG capsule; Take 1 capsule (20 mg total) by mouth daily. INSTRUCTIONS: Avoid GERD Triggers: acidic, spicy or fried foods, caffeine, coffee, sodas,  alcohol and chocolate.     Patient has been counseled on age-appropriate routine health concerns for screening and prevention. These are reviewed and up-to-date. Referrals have been placed accordingly. Immunizations are up-to-date or declined.    Subjective:   Chief Complaint  Patient presents with  . Follow-up    Pt. asking for medication refill.   HPI Alexander Hunt 63 y.o. male presents to office today for telehealth  visit.  Doing well today with no complaints.  He was instructed last month to stop hydrochlorothiazide due to elevated creatinine.  Unfortunately he has not been monitoring his blood pressure.  I have instructed him today to have the employee health nurse check his blood pressure while he is at work and he will call back by the end of the week with blood pressure readings.  Will come in for lab work within the next 2 weeks to recheck serum creatinine.   ESSENTIAL HYPERTENSION Chronic. Has been well controlled at his most recent office visit.  He does not monitor his blood pressure routinely but states there is an [employer] stream[employer] and his job that can monitor the employee's blood pressures.  Current antihypertensive regimen includes amlodipine 10 mg daily and lisinopril 40 mg daily.Denies chest pain, shortness of breath, palpitations, lightheadedness, dizziness, headaches or BLE edema.  BP Readings from Last 3 Encounters:  03/31/18 124/74  01/23/18 127/78  12/26/17 (!) 164/95    Hyperlipidemia Patient presents for follow up to hyperlipidemia.  He is medication compliant taking atorvastatin 80 mg daily however he is not diet or exercise compliant. He denies exertional chest pressure/discomfort, lower extremity edema, poor exercise tolerance and skin xanthelasma or statin intolerance including myalgias.  LDL is not at goal.  Awaiting fasting lipid panel. Lab Results  Component Value Date   CHOL 211 (H) 12/26/2017   Lab Results  Component Value Date   HDL 44 12/26/2017   Lab Results  Component Value Date   LDLCALC  149 (H) 12/26/2017   Lab Results  Component Value Date   TRIG 90 12/26/2017   Lab Results  Component Value Date   CHOLHDL 4.8 12/26/2017   Review of Systems  Constitutional: Negative for fever, malaise/fatigue and weight loss.  HENT: Negative.  Negative for nosebleeds.   Eyes: Negative.  Negative for blurred vision, double vision and photophobia.  Respiratory:  Negative.  Negative for cough and shortness of breath.   Cardiovascular: Negative.  Negative for chest pain, palpitations and leg swelling.  Gastrointestinal: Positive for heartburn (Chronic and well controlled with PPI.). Negative for nausea and vomiting.  Genitourinary:       History of erectile dysfunction; symptoms are controlled with tadalafil  Musculoskeletal: Negative.  Negative for myalgias.  Neurological: Negative.  Negative for dizziness, focal weakness, seizures and headaches.  Psychiatric/Behavioral: Negative.  Negative for suicidal ideas.    Past Medical History:  Diagnosis Date  . Hyperlipidemia   . Hypertension 03/13/2016    Past Surgical History:  Procedure Laterality Date  . NO PAST SURGERIES      Family History  Problem Relation Age of Onset  . Diabetes Maternal Grandmother   . Hypertension Maternal Grandmother   . Colon cancer Neg Hx   . Colon polyps Neg Hx   . Rectal cancer Neg Hx   . Stomach cancer Neg Hx   . Esophageal cancer Neg Hx     Social History Reviewed with no changes to be made today.   Outpatient Medications Prior to Visit  Medication Sig Dispense Refill  . amLODipine (NORVASC) 10 MG tablet Take 1 tablet (10 mg total) by mouth daily. 90 tablet 1  . atorvastatin (LIPITOR) 80 MG tablet Take 1 tablet (80 mg total) by mouth daily. 90 tablet 2  . lisinopril (PRINIVIL,ZESTRIL) 40 MG tablet Take 1 tablet (40 mg total) by mouth daily. 90 tablet 1  . omeprazole (PRILOSEC) 20 MG capsule Take 1 capsule (20 mg total) by mouth daily. 30 capsule 3  . tadalafil (ADCIRCA/CIALIS) 20 MG tablet Take 0.5-1 tablets (10-20 mg total) by mouth daily as needed for erectile dysfunction. 5 tablet 11  . aspirin EC 81 MG tablet Take 1 tablet (81 mg total) by mouth daily. (Patient not taking: Reported on 03/31/2018) 30 tablet 2  . docusate sodium (COLACE) 100 MG capsule Take 1 capsule (100 mg total) by mouth 2 (two) times daily. (Patient not taking: Reported on 03/31/2018)  30 capsule 1  . hydrochlorothiazide (HYDRODIURIL) 12.5 MG tablet Take 1 tablet (12.5 mg total) by mouth daily. (Patient not taking: Reported on 05/13/2018) 90 tablet 1   Facility-Administered Medications Prior to Visit  Medication Dose Route Frequency Provider Last Rate Last Dose  . 0.9 %  sodium chloride infusion  500 mL Intravenous Continuous Gatha Mayer, MD        No Known Allergies     Objective:    There were no vitals taken for this visit. Wt Readings from Last 3 Encounters:  03/31/18 252 lb 6.4 oz (114.5 kg)  12/26/17 254 lb (115.2 kg)  11/01/16 253 lb 3.2 oz (114.9 kg)          Patient has been counseled extensively about nutrition and exercise as well as the importance of adherence with medications and regular follow-up. The patient was given clear instructions to go to ER or return to medical center if symptoms don't improve, worsen or new problems develop. The patient verbalized understanding.   Follow-up: Return in about 3 months (around  08/12/2018).   Gildardo Pounds, FNP-BC [facility] and [place], [place]   05/13/2018, 2:47 PM

## 2018-05-14 MED FILL — ATORVASTATIN 80 MG TABLET: 80 | 30 days supply | Qty: 30 | Fill #0

## 2018-05-14 MED FILL — AMLODIPINE BESYLATE 10 MG T: 10 | 30 days supply | Qty: 30 | Fill #0

## 2018-05-14 MED FILL — OMEPRAZOLE 20 MG CAPSULE DR: 20 | 30 days supply | Qty: 30 | Fill #0

## 2018-05-14 MED FILL — LISINOPRIL 40 MG TABLET: 40 | 30 days supply | Qty: 30 | Fill #0

## 2018-07-31 MED FILL — LISINOPRIL 40 MG TAB: 40 | 30 days supply | Qty: 30 | Fill #1

## 2018-07-31 MED FILL — AMLODIPINE BESYLATE 10 MG T: 10 | 30 days supply | Qty: 30 | Fill #1

## 2018-08-24 ENCOUNTER — Ambulatory Visit: Payer: BLUE CROSS/BLUE SHIELD | Admitting: Nurse Practitioner

## 2018-11-02 ENCOUNTER — Encounter: Payer: Self-pay | Admitting: Nurse Practitioner

## 2018-11-02 ENCOUNTER — Other Ambulatory Visit: Payer: Self-pay

## 2018-11-02 ENCOUNTER — Ambulatory Visit: Payer: BC Managed Care – PPO | Attending: Nurse Practitioner | Admitting: Nurse Practitioner

## 2018-11-02 VITALS — BP 130/80 | HR 79 | Temp 99.2°F | Ht 76.0 in | Wt 260.0 lb

## 2018-11-02 DIAGNOSIS — N529 Male erectile dysfunction, unspecified: Secondary | ICD-10-CM

## 2018-11-02 DIAGNOSIS — Z125 Encounter for screening for malignant neoplasm of prostate: Secondary | ICD-10-CM | POA: Diagnosis not present

## 2018-11-02 DIAGNOSIS — R7989 Other specified abnormal findings of blood chemistry: Secondary | ICD-10-CM

## 2018-11-02 DIAGNOSIS — I10 Essential (primary) hypertension: Secondary | ICD-10-CM | POA: Diagnosis not present

## 2018-11-02 DIAGNOSIS — R7303 Prediabetes: Secondary | ICD-10-CM | POA: Diagnosis not present

## 2018-11-02 DIAGNOSIS — E782 Mixed hyperlipidemia: Secondary | ICD-10-CM | POA: Diagnosis not present

## 2018-11-02 DIAGNOSIS — K219 Gastro-esophageal reflux disease without esophagitis: Secondary | ICD-10-CM

## 2018-11-02 DIAGNOSIS — K5909 Other constipation: Secondary | ICD-10-CM

## 2018-11-02 MED ORDER — ATORVASTATIN CALCIUM 80 MG PO TABS: 80.0000 mg | ORAL_TABLET | Freq: Every day | ORAL | 2 refills | Status: DC

## 2018-11-02 MED ORDER — OMEPRAZOLE 20 MG PO CPDR: 20.0000 mg | DELAYED_RELEASE_CAPSULE | Freq: Every day | ORAL | 1 refills | Status: DC

## 2018-11-02 MED ORDER — LISINOPRIL 40 MG PO TABS: 40.0000 mg | ORAL_TABLET | Freq: Every day | ORAL | 1 refills | Status: DC

## 2018-11-02 MED ORDER — ASPIRIN EC 81 MG PO TBEC: 81.0000 mg | DELAYED_RELEASE_TABLET | Freq: Every day | ORAL | 3 refills | Status: AC

## 2018-11-02 MED ORDER — HYDROCHLOROTHIAZIDE 12.5 MG PO TABS: 12.5000 mg | ORAL_TABLET | Freq: Every day | ORAL | 1 refills | Status: DC

## 2018-11-02 MED ORDER — TADALAFIL 20 MG PO TABS: 10.0000 mg | ORAL_TABLET | Freq: Every day | ORAL | 6 refills | Status: DC | PRN

## 2018-11-02 MED ORDER — DOCUSATE SODIUM 100 MG PO CAPS: 100.0000 mg | ORAL_CAPSULE | Freq: Two times a day (BID) | ORAL | 3 refills | Status: DC

## 2018-11-02 MED ORDER — AMLODIPINE BESYLATE 10 MG PO TABS: 10.0000 mg | ORAL_TABLET | Freq: Every day | ORAL | 1 refills | Status: DC

## 2018-11-02 MED FILL — OMEPRAZOLE 20 MG CAP: 20 | 30 days supply | Qty: 30 | Fill #0

## 2018-11-02 MED FILL — ATORVASTATIN 80 MG TABLET: 80 | 30 days supply | Qty: 30 | Fill #0

## 2018-11-02 MED FILL — LISINOPRIL 40 MG TABLET: 40 | 30 days supply | Qty: 30 | Fill #0

## 2018-11-02 MED FILL — AMLODIPINE BESYLATE 10 MG T: 10 | 30 days supply | Qty: 30 | Fill #0

## 2018-11-02 MED FILL — TADALAFIL 20 MG TABS: 20 | 30 days supply | Qty: 6 | Fill #0

## 2018-11-02 NOTE — Progress Notes (Signed)
Assessment & Plan:  Alexander Hunt was seen today for follow-up.  Diagnoses and all orders for this visit:  Prediabetes -     Hemoglobin A1c Continue blood sugar control as discussed in office today, low carbohydrate diet, and regular physical exercise as tolerated, 150 minutes per week (30 min each day, 5 days per week, or 50 min 3 days per week).   Avoid fried foods, take out,  junk foods, sodas, sugary drinks, unhealthy snacking, alcohol and smoking.    Essential hypertension -     CMP14+EGFR -     Discontinue: hydrochlorothiazide (HYDRODIURIL) 12.5 MG tablet; Take 1 tablet (12.5 mg total) by mouth daily. -     amLODipine (NORVASC) 10 MG tablet; Take 1 tablet (10 mg total) by mouth daily. -     lisinopril (ZESTRIL) 40 MG tablet; Take 1 tablet (40 mg total) by mouth daily. Continue all antihypertensives as prescribed.  Remember to bring in your blood pressure log with you for your follow up appointment.  DASH/Mediterranean Diets are healthier choices for HTN.    Mixed hyperlipidemia -     Lipid panel -     atorvastatin (LIPITOR) 80 MG tablet; Take 1 tablet (80 mg total) by mouth daily. -     aspirin EC 81 MG tablet; Take 1 tablet (81 mg total) by mouth daily. INSTRUCTIONS: Work on a low fat, heart healthy diet and participate in regular aerobic exercise program by working out at least 150 minutes per week; 5 days a week-30 minutes per day. Avoid red meat, fried foods. junk foods, sodas, sugary drinks, unhealthy snacking, alcohol and smoking.  Drink at least 48oz of water per day and monitor your carbohydrate intake daily.   Prostate cancer screening -     PSA  Abnormal CBC measurement -     CBC  Gastroesophageal reflux disease, esophagitis presence not specified -     omeprazole (PRILOSEC) 20 MG capsule; Take 1 capsule (20 mg total) by mouth daily. INSTRUCTIONS: Avoid GERD Triggers: acidic, spicy or fried foods, caffeine, coffee, sodas,  alcohol and chocolate.   Erectile  dysfunction, unspecified erectile dysfunction type -     tadalafil (CIALIS) 20 MG tablet; Take 0.5-1 tablets (10-20 mg total) by mouth daily as needed for erectile dysfunction.  Chronic constipation -     docusate sodium (COLACE) 100 MG capsule; Take 1 capsule (100 mg total) by mouth 2 (two) times daily. Drink at least 80oz of water per day   Patient has been counseled on age-appropriate routine health concerns for screening and prevention. These are reviewed and up-to-date. Referrals have been placed accordingly. Immunizations are up-to-date or declined.    Subjective:   Chief Complaint  Patient presents with  . Follow-up   HPI Armand Rubiano 63 y.o. male presents to office today for follow up.  has a past medical history of Hyperlipidemia and Hypertension (03/13/2016).   Hypertension He is not exercising and is not consistently adherent to low salt diet.  He does not have a blood pressure log  today.  HCTZ is currently on hold due to increased creatinine.  Current medications include amlodipine 10 mg and lisinopril 40 mg daily. He continues to smoke. Denies chest pain, shortness of breath, palpitations, lightheadedness, dizziness, headaches or BLE edema.  BP Readings from Last 3 Encounters:  11/02/18 (!) 158/96  03/31/18 124/74  01/23/18 127/78    Hyperlipidemia Patient presents for follow up to hyperlipidemia.  He is medication compliant taking lipitor 80 mg daily. He  is not consistently diet compliant and denies statin intolerance including myalgias.  Lab Results  Component Value Date   CHOL 211 (H) 12/26/2017   Lab Results  Component Value Date   HDL 44 12/26/2017   Lab Results  Component Value Date   LDLCALC 149 (H) 12/26/2017   Lab Results  Component Value Date   TRIG 90 12/26/2017   Lab Results  Component Value Date   CHOLHDL 4.8 12/26/2017      Review of Systems  Constitutional: Negative for fever, malaise/fatigue and weight loss.  HENT: Negative.   Negative for nosebleeds.   Eyes: Negative.  Negative for blurred vision, double vision and photophobia.  Respiratory: Negative.  Negative for cough and shortness of breath.   Cardiovascular: Negative.  Negative for chest pain, palpitations and leg swelling.  Gastrointestinal: Positive for constipation and heartburn. Negative for abdominal pain, blood in stool, diarrhea, melena, nausea and vomiting.  Genitourinary:       Erectile dysfunction  Musculoskeletal: Negative.  Negative for myalgias.  Neurological: Negative.  Negative for dizziness, focal weakness, seizures and headaches.  Psychiatric/Behavioral: Negative.  Negative for suicidal ideas.    Past Medical History:  Diagnosis Date  . Hyperlipidemia   . Hypertension 03/13/2016    Past Surgical History:  Procedure Laterality Date  . NO PAST SURGERIES      Family History  Problem Relation Age of Onset  . Diabetes Maternal Grandmother   . Hypertension Maternal Grandmother   . Colon cancer Neg Hx   . Colon polyps Neg Hx   . Rectal cancer Neg Hx   . Stomach cancer Neg Hx   . Esophageal cancer Neg Hx     Social History Reviewed with no changes to be made today.   Outpatient Medications Prior to Visit  Medication Sig Dispense Refill  . amLODipine (NORVASC) 10 MG tablet Take 1 tablet (10 mg total) by mouth daily. 90 tablet 1  . atorvastatin (LIPITOR) 80 MG tablet Take 1 tablet (80 mg total) by mouth daily. 90 tablet 2  . lisinopril (PRINIVIL,ZESTRIL) 40 MG tablet Take 1 tablet (40 mg total) by mouth daily. 90 tablet 1  . tadalafil (ADCIRCA/CIALIS) 20 MG tablet Take 0.5-1 tablets (10-20 mg total) by mouth daily as needed for erectile dysfunction. 5 tablet 11  . aspirin EC 81 MG tablet Take 1 tablet (81 mg total) by mouth daily. (Patient not taking: Reported on 03/31/2018) 30 tablet 2  . docusate sodium (COLACE) 100 MG capsule Take 1 capsule (100 mg total) by mouth 2 (two) times daily. (Patient not taking: Reported on 03/31/2018) 30  capsule 1  . hydrochlorothiazide (HYDRODIURIL) 12.5 MG tablet Take 1 tablet (12.5 mg total) by mouth daily. (Patient not taking: Reported on 05/13/2018) 90 tablet 1  . omeprazole (PRILOSEC) 20 MG capsule Take 1 capsule (20 mg total) by mouth daily. 90 capsule 0   Facility-Administered Medications Prior to Visit  Medication Dose Route Frequency Provider Last Rate Last Dose  . 0.9 %  sodium chloride infusion  500 mL Intravenous Continuous Gatha Mayer, MD        No Known Allergies     Objective:    BP (!) 158/96 (BP Location: Left Arm, Patient Position: Sitting, Cuff Size: Large)   Pulse 79   Temp 99.2 F (37.3 C) (Oral)   Ht 6' 4"  (1.93 m)   Wt 260 lb (117.9 kg)   SpO2 98%   BMI 31.65 kg/m  Wt Readings from Last 3 Encounters:  11/02/18 260 lb (117.9 kg)  03/31/18 252 lb 6.4 oz (114.5 kg)  12/26/17 254 lb (115.2 kg)    Physical Exam Vitals signs and nursing note reviewed.  Constitutional:      Appearance: He is well-developed.  HENT:     Head: Normocephalic and atraumatic.  Neck:     Musculoskeletal: Normal range of motion.  Cardiovascular:     Rate and Rhythm: Normal rate and regular rhythm.     Heart sounds: Normal heart sounds. No murmur. No friction rub. No gallop.   Pulmonary:     Effort: Pulmonary effort is normal. No tachypnea or respiratory distress.     Breath sounds: Normal breath sounds. No decreased breath sounds, wheezing, rhonchi or rales.  Chest:     Chest wall: No tenderness.  Abdominal:     General: Bowel sounds are normal.     Palpations: Abdomen is soft.  Musculoskeletal: Normal range of motion.  Skin:    General: Skin is warm and dry.  Neurological:     Mental Status: He is alert and oriented to person, place, and time.     Coordination: Coordination normal.  Psychiatric:        Behavior: Behavior normal. Behavior is cooperative.        Thought Content: Thought content normal.        Judgment: Judgment normal.          Patient has  been counseled extensively about nutrition and exercise as well as the importance of adherence with medications and regular follow-up. The patient was given clear instructions to go to ER or return to medical center if symptoms don't improve, worsen or new problems develop. The patient verbalized understanding.   Follow-up: Return in about 3 months (around 02/01/2019) for HTN/HPL.   Gildardo Pounds, FNP-BC [facility] and [facility], [place]   11/02/2018, 9:04 AM

## 2018-11-03 ENCOUNTER — Other Ambulatory Visit: Payer: Self-pay | Admitting: Nurse Practitioner

## 2018-11-03 DIAGNOSIS — R7989 Other specified abnormal findings of blood chemistry: Secondary | ICD-10-CM

## 2018-11-03 LAB — CBC
Hematocrit: 43.4 % (ref 37.5–51.0)
Hemoglobin: 13.4 g/dL (ref 13.0–17.7)
MCH: 20.4 pg — ABNORMAL LOW (ref 26.6–33.0)
MCHC: 30.9 g/dL — ABNORMAL LOW (ref 31.5–35.7)
MCV: 66 fL — ABNORMAL LOW (ref 79–97)
Platelets: 210 10*3/uL (ref 150–450)
RBC: 6.57 x10E6/uL — ABNORMAL HIGH (ref 4.14–5.80)
RDW: 18.4 % — ABNORMAL HIGH (ref 11.6–15.4)
WBC: 5.6 10*3/uL (ref 3.4–10.8)

## 2018-11-03 LAB — CMP14+EGFR
ALT: 29 IU/L (ref 0–44)
AST: 25 IU/L (ref 0–40)
Albumin/Globulin Ratio: 1.5 (ref 1.2–2.2)
Albumin: 4.8 g/dL (ref 3.8–4.8)
Alkaline Phosphatase: 97 IU/L (ref 39–117)
BUN/Creatinine Ratio: 8 — ABNORMAL LOW (ref 10–24)
BUN: 12 mg/dL (ref 8–27)
Bilirubin Total: 0.2 mg/dL (ref 0.0–1.2)
CO2: 20 mmol/L (ref 20–29)
Calcium: 9.8 mg/dL (ref 8.6–10.2)
Chloride: 106 mmol/L (ref 96–106)
Creatinine, Ser: 1.42 mg/dL — ABNORMAL HIGH (ref 0.76–1.27)
GFR calc Af Amer: 61 mL/min/{1.73_m2} (ref >59)
GFR calc non Af Amer: 53 mL/min/{1.73_m2} — ABNORMAL LOW (ref >59)
Globulin, Total: 3.2 g/dL (ref 1.5–4.5)
Glucose: 104 mg/dL — ABNORMAL HIGH (ref 65–99)
Potassium: 4.9 mmol/L (ref 3.5–5.2)
Sodium: 140 mmol/L (ref 134–144)
Total Protein: 8 g/dL (ref 6.0–8.5)

## 2018-11-03 LAB — HEMOGLOBIN A1C
Est. average glucose Bld gHb Est-mCnc: 128 mg/dL
Hgb A1c MFr Bld: 6.1 % — ABNORMAL HIGH (ref 4.8–5.6)

## 2018-11-03 LAB — LIPID PANEL
Chol/HDL Ratio: 4.8 ratio (ref 0.0–5.0)
Cholesterol, Total: 208 mg/dL — ABNORMAL HIGH (ref 100–199)
HDL: 43 mg/dL (ref >39)
LDL Chol Calc (NIH): 141 mg/dL — ABNORMAL HIGH (ref 0–99)
Triglycerides: 131 mg/dL (ref 0–149)
VLDL Cholesterol Cal: 24 mg/dL (ref 5–40)

## 2018-11-03 LAB — PSA: Prostate Specific Ag, Serum: 1.6 ng/mL (ref 0.0–4.0)

## 2018-11-04 ENCOUNTER — Telehealth: Payer: Self-pay | Admitting: Oncology

## 2018-11-04 NOTE — Telephone Encounter (Signed)
Received a new hem referral from Geryl Rankins at [facility] for abnl cbc. I cld and scheduled [name] to see Dr. Alen Blew on 10/21 at 11am. He's been made aware to arrive 15 minutes early.

## 2018-11-25 ENCOUNTER — Other Ambulatory Visit: Payer: Self-pay

## 2018-11-25 ENCOUNTER — Inpatient Hospital Stay: Payer: BC Managed Care – PPO | Attending: Oncology | Admitting: Oncology

## 2018-11-25 VITALS — BP 148/89 | HR 83 | Temp 97.8°F | Resp 18 | Ht 76.0 in | Wt 263.9 lb

## 2018-11-25 DIAGNOSIS — R718 Other abnormality of red blood cells: Secondary | ICD-10-CM

## 2018-11-25 NOTE — Progress Notes (Signed)
Reason for the request:    Microcytosis  HPI: I was asked by Geryl Rankins, NP to evaluate [name] for evaluation of microcytosis.  He is a 63 year old man with history of hypertension and hyperlipidemia but no other comorbid conditions.  He had a CBC done on 11/02/2018 showed a hemoglobin of 13.4 with an MCV of 66.  His RDW elevated at 18.4 but normal white cell count and platelets.  Previous CBCs dating back to 2014 had similar pattern.  In September 2014 his hemoglobin was 13.2 with an MCV of 64 mildly elevated RDW at 15.9.  Peripheral smear does show target cells and elliptocytes.  He does not report any complaints at this time.  Denies any nausea, vomiting or abdominal pain.  He denies any excessive fatigue or tiredness.  Denies any constitutional symptoms.  He denies any family history of thalassemia sickle cell anemia.  He denies any family history of any malignancy or blood disorders.  He does not report any headaches, blurry vision, syncope or seizures. Does not report any fevers, chills or sweats.  Does not report any cough, wheezing or hemoptysis.  Does not report any chest pain, palpitation, orthopnea or leg edema.  Does not report any nausea, vomiting or abdominal pain.  Does not report any constipation or diarrhea.  Does not report any skeletal complaints.    Does not report frequency, urgency or hematuria.  Does not report any skin rashes or lesions. Does not report any heat or cold intolerance.  Does not report any lymphadenopathy or petechiae.  Does not report any anxiety or depression.  Remaining review of systems is negative.    Past Medical History:  Diagnosis Date  . Hyperlipidemia   . Hypertension 03/13/2016  :  Past Surgical History:  Procedure Laterality Date  . NO PAST SURGERIES    :   Current Outpatient Medications:  .  amLODipine (NORVASC) 10 MG tablet, Take 1 tablet (10 mg total) by mouth daily., Disp: 90 tablet, Rfl: 1 .  aspirin EC 81 MG tablet, Take 1 tablet  (81 mg total) by mouth daily., Disp: 90 tablet, Rfl: 3 .  atorvastatin (LIPITOR) 80 MG tablet, Take 1 tablet (80 mg total) by mouth daily., Disp: 90 tablet, Rfl: 2 .  docusate sodium (COLACE) 100 MG capsule, Take 1 capsule (100 mg total) by mouth 2 (two) times daily., Disp: 90 capsule, Rfl: 3 .  lisinopril (ZESTRIL) 40 MG tablet, Take 1 tablet (40 mg total) by mouth daily., Disp: 90 tablet, Rfl: 1 .  omeprazole (PRILOSEC) 20 MG capsule, Take 1 capsule (20 mg total) by mouth daily., Disp: 90 capsule, Rfl: 1 .  tadalafil (CIALIS) 20 MG tablet, Take 0.5-1 tablets (10-20 mg total) by mouth daily as needed for erectile dysfunction., Disp: 10 tablet, Rfl: 6  Current Facility-Administered Medications:  .  0.9 %  sodium chloride infusion, 500 mL, Intravenous, Continuous, Gatha Mayer, MD:  No Known Allergies:  Family History  Problem Relation Age of Onset  . Diabetes Maternal Grandmother   . Hypertension Maternal Grandmother   . Colon cancer Neg Hx   . Colon polyps Neg Hx   . Rectal cancer Neg Hx   . Stomach cancer Neg Hx   . Esophageal cancer Neg Hx   :  Social History   Socioeconomic History  . Marital status: Single    Spouse name: Not on file  . Number of children: Not on file  . Years of education: Not on file  .  Highest education level: Not on file  Occupational History  . Not on file  Social Needs  . Financial resource strain: Not on file  . Food insecurity    Worry: Not on file    Inability: Not on file  . Transportation needs    Medical: Not on file    Non-medical: Not on file  Tobacco Use  . Smoking status: Current Some Day Smoker  . Smokeless tobacco: Never Used  Substance and Sexual Activity  . Alcohol use: Yes    Comment: occ  . Drug use: No  . Sexual activity: Yes  Lifestyle  . Physical activity    Days per week: Not on file    Minutes per session: Not on file  . Stress: Not on file  Relationships  . Social Musician on phone: Not on file     Gets together: Not on file    Attends religious service: Not on file    Active member of club or organization: Not on file    Attends meetings of clubs or organizations: Not on file    Relationship status: Not on file  . Intimate partner violence    Fear of current or ex partner: Not on file    Emotionally abused: Not on file    Physically abused: Not on file    Forced sexual activity: Not on file  Other Topics Concern  . Not on file  Social History Narrative  . Not on file  :  Pertinent items are noted in HPI.  Exam: Blood pressure (!) 148/89, pulse 83, temperature 97.8 F (36.6 C), temperature source Oral, resp. rate 18, height 6\' 4"  (1.93 m), weight 263 lb 14.4 oz (119.7 kg), SpO2 100 %.  ECOG 0 General appearance: alert and cooperative appeared without distress. Head: atraumatic without any abnormalities. Eyes: conjunctivae/corneas clear. PERRL.  Sclera anicteric. Throat: lips, mucosa, and tongue normal; without oral thrush or ulcers. Resp: clear to auscultation bilaterally without rhonchi, wheezes or dullness to percussion. Cardio: regular rate and rhythm, S1, S2 normal, no murmur, click, rub or gallop GI: soft, non-tender; bowel sounds normal; no masses,  no organomegaly Skin: Skin color, texture, turgor normal. No rashes or lesions Lymph nodes: Cervical, supraclavicular, and axillary nodes normal. Neurologic: Grossly normal without any motor, sensory or deep tendon reflexes. Musculoskeletal: No joint deformity or effusion.  CBC    Component Value Date/Time   WBC 5.6 11/02/2018 0900   WBC 7.0 03/13/2016 1105   RBC 6.57 (H) 11/02/2018 0900   RBC 6.47 (H) 03/13/2016 1105   HGB 13.4 11/02/2018 0900   HCT 43.4 11/02/2018 0900   PLT 210 11/02/2018 0900   MCV 66 (L) 11/02/2018 0900   MCH 20.4 (L) 11/02/2018 0900   MCH 21.0 (L) 03/13/2016 1105   MCHC 30.9 (L) 11/02/2018 0900   MCHC 32.6 03/13/2016 1105   RDW 18.4 (H) 11/02/2018 0900   LYMPHSABS 1,470 03/13/2016 1105    MONOABS 350 03/13/2016 1105   EOSABS 70 03/13/2016 1105   BASOSABS 70 03/13/2016 1105    Assessment and Plan:   63 year old with:  1.  Microcytosis without anemia dating back to at least 2014 with an MCV between 64 and 66 and normal hemoglobin.  His peripheral smear does show target cells.  The differential diagnosis was reviewed today with the patient.  Hemoglobinopathy with likely mild form of thalassemia is a likely etiology.  Low MCV without anemia likely indicate thalassemia and less likely iron deficiency.  Given the chronicity of these findings and the fact that his hemoglobin remains normal likely indicates hemoglobinopathy rather than iron deficiency.  From a management standpoint, it appears to be very mild and is not affecting his hemoglobin.  No additional testing or intervention is needed at this time.  If he develops anemia in the future, checking his iron studies may be helpful and replace that as needed.  His children are grown at this time and there is really no indication of any further testing at this time.  No treatment is needed at this time or likely indicated in the future.   2.  Follow-up: I am happy to see him in the future as needed.   40  minutes was spent with the patient face-to-face today.  More than 50% of time was spent on reviewing laboratory data, discussing differential diagnosis and management options.    Thank you for the referral.  I had the pleasure of meeting this patient today.  A copy of this consult has been forwarded to the requesting physician.

## 2019-02-02 ENCOUNTER — Ambulatory Visit: Payer: BC Managed Care – PPO | Attending: Nurse Practitioner | Admitting: Nurse Practitioner

## 2019-02-02 ENCOUNTER — Encounter: Payer: Self-pay | Admitting: Nurse Practitioner

## 2019-02-02 ENCOUNTER — Other Ambulatory Visit: Payer: Self-pay

## 2019-02-02 DIAGNOSIS — I10 Essential (primary) hypertension: Secondary | ICD-10-CM

## 2019-02-02 DIAGNOSIS — K219 Gastro-esophageal reflux disease without esophagitis: Secondary | ICD-10-CM

## 2019-02-02 DIAGNOSIS — E782 Mixed hyperlipidemia: Secondary | ICD-10-CM | POA: Diagnosis not present

## 2019-02-02 DIAGNOSIS — K5909 Other constipation: Secondary | ICD-10-CM

## 2019-02-02 DIAGNOSIS — N529 Male erectile dysfunction, unspecified: Secondary | ICD-10-CM

## 2019-02-02 MED ORDER — AMLODIPINE BESYLATE 10 MG PO TABS: 10.0000 mg | ORAL_TABLET | Freq: Every day | ORAL | 1 refills | Status: DC

## 2019-02-02 MED ORDER — OMEPRAZOLE 20 MG PO CPDR: 20.0000 mg | DELAYED_RELEASE_CAPSULE | Freq: Every day | ORAL | 1 refills | Status: DC

## 2019-02-02 MED ORDER — ATORVASTATIN CALCIUM 80 MG PO TABS: 80.0000 mg | ORAL_TABLET | Freq: Every day | ORAL | 2 refills | Status: DC

## 2019-02-02 MED ORDER — LISINOPRIL 40 MG PO TABS: 40.0000 mg | ORAL_TABLET | Freq: Every day | ORAL | 1 refills | Status: DC

## 2019-02-02 MED ORDER — TADALAFIL 20 MG PO TABS: 10.0000 mg | ORAL_TABLET | Freq: Every day | ORAL | 6 refills | Status: DC | PRN

## 2019-02-02 MED ORDER — DOCUSATE SODIUM 100 MG PO CAPS: 100.0000 mg | ORAL_CAPSULE | Freq: Two times a day (BID) | ORAL | 3 refills | Status: DC

## 2019-02-02 MED FILL — AMLODIPINE BESYLATE 10 MG T: 10 | 30 days supply | Qty: 30 | Fill #0

## 2019-02-02 MED FILL — ATORVASTATIN 80 MG TABLET: 80 | 30 days supply | Qty: 30 | Fill #0

## 2019-02-02 MED FILL — TADALAFIL 20 MG TABS: 20 | 30 days supply | Qty: 6 | Fill #0

## 2019-02-02 MED FILL — OMEPRAZOLE 20 MG CAP: 20 | 30 days supply | Qty: 30 | Fill #0

## 2019-02-02 MED FILL — LISINOPRIL 40 MG TABLET: 40 | 30 days supply | Qty: 30 | Fill #0

## 2019-02-02 NOTE — Progress Notes (Signed)
Assessment & Plan:  Alexander Hunt was seen today for follow-up.  Diagnoses and all orders for this visit:  Essential hypertension -     lisinopril (ZESTRIL) 40 MG tablet; Take 1 tablet (40 mg total) by mouth daily. Patient would like mailed to home. Please fill as a 90 day supply -     amLODipine (NORVASC) 10 MG tablet; Take 1 tablet (10 mg total) by mouth daily. Patient would like mailed to home. Please fill as a 90 day supply Continue all antihypertensives as prescribed.  Remember to bring in your blood pressure log with you for your follow up appointment.  DASH/Mediterranean Diets are healthier choices for HTN.    Chronic constipation -     docusate sodium (COLACE) 100 MG capsule; Take 1 capsule (100 mg total) by mouth 2 (two) times daily. Patient would like mailed to home. Increase water intake to at least 64 oz per day  Mixed hyperlipidemia -     atorvastatin (LIPITOR) 80 MG tablet; Take 1 tablet (80 mg total) by mouth daily. Patient would like mailed to home. Please fill as a 90 day supply INSTRUCTIONS: Work on a low fat, heart healthy diet and participate in regular aerobic exercise program by working out at least 150 minutes per week; 5 days a week-30 minutes per day. Avoid red meat/beef/steak,  fried foods. junk foods, sodas, sugary drinks, unhealthy snacking, alcohol and smoking.  Drink at least 80 oz of water per day and monitor your carbohydrate intake daily.    GERD without esophagitis -     omeprazole (PRILOSEC) 20 MG capsule; Take 1 capsule (20 mg total) by mouth daily. Patient would like mailed to home. Please fill as a 90 day supply INSTRUCTIONS: Avoid GERD Triggers: acidic, spicy or fried foods, caffeine, coffee, sodas,  alcohol and chocolate.   Erectile dysfunction, unspecified erectile dysfunction type -     tadalafil (CIALIS) 20 MG tablet; Take 0.5-1 tablets (10-20 mg total) by mouth daily as needed for erectile dysfunction. Patient would like mailed to  home.    Patient has been counseled on age-appropriate routine health concerns for screening and prevention. These are reviewed and up-to-date. Referrals have been placed accordingly. Immunizations are up-to-date or declined.    Subjective:   Chief Complaint  Patient presents with  . Follow-up    Pt. is here for 3 months follow up.    HPI Alexander Hunt 63 y.o. male presents for tele health visit today for HTN.   Essential Hypertension Does not have a way to monitor his blood pressures at home.  I recommended he purchase a home monitoring device for readings.He endorses medication compliance taking amlodipine 10 mg daily and lisinopril 40 mg daily.  He was taken off hydrochlorothiazide a few months ago due to elevated creatinine.  Will recheck labs. Denies chest pain, shortness of breath, palpitations, lightheadedness, dizziness, headaches or BLE edema.  He does continue to smoke and declines any smoking cessation therapies today. BP Readings from Last 3 Encounters:  11/25/18 (!) 148/89  11/02/18 130/80  03/31/18 124/74     Review of Systems  Constitutional: Negative for fever, malaise/fatigue and weight loss.  HENT: Negative.  Negative for nosebleeds.   Eyes: Negative.  Negative for blurred vision, double vision and photophobia.  Respiratory: Negative.  Negative for cough and shortness of breath.   Cardiovascular: Negative.  Negative for chest pain, palpitations and leg swelling.  Gastrointestinal: Positive for heartburn. Negative for nausea and vomiting.  Genitourinary:  ED  Musculoskeletal: Negative.  Negative for myalgias.  Neurological: Negative.  Negative for dizziness, focal weakness, seizures and headaches.  Psychiatric/Behavioral: Negative.  Negative for suicidal ideas.    Past Medical History:  Diagnosis Date  . Hyperlipidemia   . Hypertension 03/13/2016    Past Surgical History:  Procedure Laterality Date  . NO PAST SURGERIES      Family History   Problem Relation Age of Onset  . Diabetes Maternal Grandmother   . Hypertension Maternal Grandmother   . Colon cancer Neg Hx   . Colon polyps Neg Hx   . Rectal cancer Neg Hx   . Stomach cancer Neg Hx   . Esophageal cancer Neg Hx     Social History Reviewed with no changes to be made today.   Outpatient Medications Prior to Visit  Medication Sig Dispense Refill  . amLODipine (NORVASC) 10 MG tablet Take 1 tablet (10 mg total) by mouth daily. 90 tablet 1  . atorvastatin (LIPITOR) 80 MG tablet Take 1 tablet (80 mg total) by mouth daily. 90 tablet 2  . docusate sodium (COLACE) 100 MG capsule Take 1 capsule (100 mg total) by mouth 2 (two) times daily. 90 capsule 3  . lisinopril (ZESTRIL) 40 MG tablet Take 1 tablet (40 mg total) by mouth daily. 90 tablet 1  . tadalafil (CIALIS) 20 MG tablet Take 0.5-1 tablets (10-20 mg total) by mouth daily as needed for erectile dysfunction. 10 tablet 6  . omeprazole (PRILOSEC) 20 MG capsule Take 1 capsule (20 mg total) by mouth daily. 90 capsule 1   Facility-Administered Medications Prior to Visit  Medication Dose Route Frequency Provider Last Rate Last Admin  . 0.9 %  sodium chloride infusion  500 mL Intravenous Continuous Iva Boop, MD        No Known Allergies     Objective:    There were no vitals taken for this visit. Wt Readings from Last 3 Encounters:  11/25/18 263 lb 14.4 oz (119.7 kg)  11/02/18 260 lb (117.9 kg)  03/31/18 252 lb 6.4 oz (114.5 kg)      Total time spent over the telephone with patient was 18 min. Greater than 50 % of this visit was spent face to face counseling and coordinating care regarding risk factor modification, compliance importance and encouragement, education related to HTN and tobacco dependence.    Patient has been counseled extensively about nutrition and exercise as well as the importance of adherence with medications and regular follow-up. The patient was given clear instructions to go to ER or return  to medical center if symptoms don't improve, worsen or new problems develop. The patient verbalized understanding.   Follow-up: Return in about 3 months (around 05/03/2019).   Alexander Rigg, FNP-BC [facility] and [facility] [place], [place] [phone]   02/02/2019, 8:54 AM

## 2019-02-09 ENCOUNTER — Other Ambulatory Visit: Payer: BC Managed Care – PPO

## 2019-02-27 ENCOUNTER — Encounter (HOSPITAL_COMMUNITY): Payer: Self-pay | Admitting: Emergency Medicine

## 2019-02-27 ENCOUNTER — Other Ambulatory Visit: Payer: Self-pay

## 2019-02-27 ENCOUNTER — Emergency Department (HOSPITAL_COMMUNITY): Payer: BC Managed Care – PPO

## 2019-02-27 ENCOUNTER — Emergency Department (HOSPITAL_COMMUNITY)
Admission: EM | Admit: 2019-02-27 | Discharge: 2019-02-28 | Disposition: A | Discharge status: Home / Self Care | Payer: BC Managed Care – PPO | Attending: Emergency Medicine | Admitting: Emergency Medicine

## 2019-02-27 DIAGNOSIS — S02832A Fracture of medial orbital wall, left side, initial encounter for closed fracture: Secondary | ICD-10-CM | POA: Insufficient documentation

## 2019-02-27 DIAGNOSIS — S02401A Maxillary fracture, unspecified, initial encounter for closed fracture: Secondary | ICD-10-CM | POA: Insufficient documentation

## 2019-02-27 DIAGNOSIS — F10929 Alcohol use, unspecified with intoxication, unspecified: Secondary | ICD-10-CM | POA: Diagnosis not present

## 2019-02-27 DIAGNOSIS — S199XXA Unspecified injury of neck, initial encounter: Secondary | ICD-10-CM | POA: Diagnosis not present

## 2019-02-27 DIAGNOSIS — Z79899 Other long term (current) drug therapy: Secondary | ICD-10-CM | POA: Insufficient documentation

## 2019-02-27 DIAGNOSIS — Y999 Unspecified external cause status: Secondary | ICD-10-CM | POA: Diagnosis not present

## 2019-02-27 DIAGNOSIS — Y929 Unspecified place or not applicable: Secondary | ICD-10-CM | POA: Diagnosis not present

## 2019-02-27 DIAGNOSIS — T07XXXA Unspecified multiple injuries, initial encounter: Secondary | ICD-10-CM | POA: Diagnosis not present

## 2019-02-27 DIAGNOSIS — Y939 Activity, unspecified: Secondary | ICD-10-CM | POA: Diagnosis not present

## 2019-02-27 DIAGNOSIS — S0232XA Fracture of orbital floor, left side, initial encounter for closed fracture: Secondary | ICD-10-CM | POA: Insufficient documentation

## 2019-02-27 DIAGNOSIS — I10 Essential (primary) hypertension: Secondary | ICD-10-CM | POA: Diagnosis not present

## 2019-02-27 DIAGNOSIS — S02842A Fracture of lateral orbital wall, left side, initial encounter for closed fracture: Secondary | ICD-10-CM | POA: Insufficient documentation

## 2019-02-27 DIAGNOSIS — Z720 Tobacco use: Secondary | ICD-10-CM | POA: Diagnosis not present

## 2019-02-27 DIAGNOSIS — R609 Edema, unspecified: Secondary | ICD-10-CM | POA: Diagnosis not present

## 2019-02-27 DIAGNOSIS — S0590XA Unspecified injury of unspecified eye and orbit, initial encounter: Secondary | ICD-10-CM | POA: Diagnosis not present

## 2019-02-27 DIAGNOSIS — S0101XA Laceration without foreign body of scalp, initial encounter: Secondary | ICD-10-CM | POA: Diagnosis not present

## 2019-02-27 DIAGNOSIS — S0003XA Contusion of scalp, initial encounter: Secondary | ICD-10-CM | POA: Diagnosis not present

## 2019-02-27 MED ORDER — TETRACAINE HCL 0.5 % OP SOLN
2.0000 [drp] | Freq: Once | OPHTHALMIC | Status: AC
  Administered 2019-02-27: 2 [drp] via OPHTHALMIC
  Filled 2019-02-27: qty 4

## 2019-02-27 MED ORDER — LIDOCAINE-EPINEPHRINE (PF) 2 %-1:200000 IJ SOLN
20.0000 mL | Freq: Once | INTRAMUSCULAR | Status: AC
  Administered 2019-02-27: 20 mL via INTRADERMAL
  Filled 2019-02-27: qty 20

## 2019-02-27 MED ORDER — AMOXICILLIN-POT CLAVULANATE 875-125 MG PO TABS: 1.0000 | ORAL_TABLET | Freq: Two times a day (BID) | ORAL | 0 refills | Status: DC

## 2019-02-27 MED ORDER — ERYTHROMYCIN 5 MG/GM OP OINT: TOPICAL_OINTMENT | OPHTHALMIC | 0 refills | Status: DC
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
## 2019-02-27 NOTE — Discharge Instructions (Addendum)
Apply Erythromycin ointment to the eyelid 4 times a day for the next week Wear eye patch while awake to help with double vision Call Dr. Benard Rink office (eye doctor) on Monday to set up an appointment Also call Dr. Jenne Pane office (ENT doctor) on Monday for an appointment on Thursday Take Augmentin twice a day for the next week to prevent infection  For the stitches: Keep scalp clean and dry. There are 8 stitches total that need to be removed in one week  Please return for any worsening symptoms
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
## 2019-03-02 ENCOUNTER — Telehealth: Payer: Self-pay | Admitting: Nurse Practitioner

## 2019-03-02 DIAGNOSIS — H209 Unspecified iridocyclitis: Secondary | ICD-10-CM | POA: Diagnosis not present
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
## 2019-03-03 ENCOUNTER — Other Ambulatory Visit: Payer: Self-pay | Admitting: Nurse Practitioner

## 2019-03-03 DIAGNOSIS — S02401A Maxillary fracture, unspecified, initial encounter for closed fracture: Secondary | ICD-10-CM

## 2019-03-03 DIAGNOSIS — S0285XA Fracture of orbit, unspecified, initial encounter for closed fracture: Secondary | ICD-10-CM

## 2019-03-04 ENCOUNTER — Encounter: Payer: Self-pay | Admitting: Nurse Practitioner

## 2019-03-04 DIAGNOSIS — S0285XD Fracture of orbit, unspecified, subsequent encounter for fracture with routine healing: Secondary | ICD-10-CM | POA: Diagnosis not present

## 2019-03-04 NOTE — Telephone Encounter (Signed)
Letter is in his chart 

## 2019-03-05 NOTE — Telephone Encounter (Signed)
CMA spoke to patient and informed his letter is ready for pick up.  Pt. Understood.

## 2019-03-10 ENCOUNTER — Ambulatory Visit: Payer: BC Managed Care – PPO

## 2019-03-15 DIAGNOSIS — S0232XD Fracture of orbital floor, left side, subsequent encounter for fracture with routine healing: Secondary | ICD-10-CM | POA: Diagnosis not present

## 2019-03-18 DIAGNOSIS — H538 Other visual disturbances: Secondary | ICD-10-CM | POA: Diagnosis not present

## 2019-03-18 DIAGNOSIS — T148XXA Other injury of unspecified body region, initial encounter: Secondary | ICD-10-CM | POA: Diagnosis not present

## 2019-03-18 DIAGNOSIS — S0285XD Fracture of orbit, unspecified, subsequent encounter for fracture with routine healing: Secondary | ICD-10-CM | POA: Diagnosis not present

## 2019-04-13 ENCOUNTER — Telehealth: Payer: Self-pay | Admitting: Nurse Practitioner

## 2019-04-13 MED FILL — LISINOPRIL 40 MG TABLET: 40 | 30 days supply | Qty: 30 | Fill #1

## 2019-04-13 MED FILL — AMLODIPINE BESYLATE 10 MG T: 10 | 30 days supply | Qty: 30 | Fill #1

## 2019-04-13 NOTE — Telephone Encounter (Signed)
Refills were available in our pharmacy. These were placed.

## 2019-04-13 NOTE — Telephone Encounter (Signed)
1) Medication(s) Requested (by name):amLODipine (NORVASC) 10 MG tablet [[id number] lisinopril (ZESTRIL) 40 MG tablet [[id number]]    2) Pharmacy of Choice:[facility] - [place], [place] - [address]  [address], [place] [address]  Phone:  [phone] Fax:  [phone]   3) Special Requests:   Approved medications will be sent to the pharmacy, we will reach out if there is an issue.  Requests made after 3pm may not be addressed until the following business day!  If a patient is unsure of the name of the medication(s) please note and ask patient to call back when they are able to provide all info, do not send to responsible party until all information is available!

## 2019-04-20 MED FILL — TADALAFIL 20 MG TABS: 20 | 30 days supply | Qty: 6 | Fill #1

## 2019-04-20 MED FILL — ATORVASTATIN 80 MG TABLET: 80 | 30 days supply | Qty: 30 | Fill #1

## 2019-05-04 ENCOUNTER — Ambulatory Visit: Payer: BC Managed Care – PPO | Attending: Nurse Practitioner | Admitting: Nurse Practitioner

## 2019-05-04 ENCOUNTER — Encounter: Payer: Self-pay | Admitting: Nurse Practitioner

## 2019-05-04 ENCOUNTER — Other Ambulatory Visit: Payer: Self-pay

## 2019-05-04 DIAGNOSIS — F17200 Nicotine dependence, unspecified, uncomplicated: Secondary | ICD-10-CM

## 2019-05-04 DIAGNOSIS — N529 Male erectile dysfunction, unspecified: Secondary | ICD-10-CM | POA: Diagnosis not present

## 2019-05-04 DIAGNOSIS — E782 Mixed hyperlipidemia: Secondary | ICD-10-CM

## 2019-05-04 DIAGNOSIS — I10 Essential (primary) hypertension: Secondary | ICD-10-CM

## 2019-05-04 DIAGNOSIS — F17210 Nicotine dependence, cigarettes, uncomplicated: Secondary | ICD-10-CM | POA: Diagnosis not present

## 2019-05-04 DIAGNOSIS — K219 Gastro-esophageal reflux disease without esophagitis: Secondary | ICD-10-CM

## 2019-05-04 MED ORDER — TADALAFIL 5 MG PO TABS: 10.0000 mg | ORAL_TABLET | Freq: Every day | ORAL | 6 refills | Status: DC | PRN

## 2019-05-04 MED ORDER — OMEPRAZOLE 20 MG PO CPDR: 20.0000 mg | DELAYED_RELEASE_CAPSULE | Freq: Every day | ORAL | 1 refills | Status: DC

## 2019-05-04 MED ORDER — AMLODIPINE BESYLATE 10 MG PO TABS: 10.0000 mg | ORAL_TABLET | Freq: Every day | ORAL | 1 refills | Status: DC

## 2019-05-04 MED ORDER — ATORVASTATIN CALCIUM 80 MG PO TABS: 80.0000 mg | ORAL_TABLET | Freq: Every day | ORAL | 2 refills | Status: DC

## 2019-05-04 MED ORDER — LISINOPRIL 40 MG PO TABS: 40.0000 mg | ORAL_TABLET | Freq: Every day | ORAL | 1 refills | Status: DC

## 2019-05-04 MED FILL — OMEPRAZOLE 20 MG CAP: 20 | 90 days supply | Qty: 90 | Fill #0

## 2019-05-04 NOTE — Progress Notes (Signed)
Virtual Visit via Telephone Note Due to national recommendations of social distancing due to COVID 19, telehealth visit is felt to be most appropriate for this patient at this time.  I discussed the limitations, risks, security and privacy concerns of performing an evaluation and management service by telephone and the availability of in person appointments. I also discussed with the patient that there may be a patient responsible charge related to this service. The patient expressed understanding and agreed to proceed.    I connected with Alexander Hunt on 05/04/19  at   9:50 AM EDT  EDT by telephone and verified that I am speaking with the correct person using two identifiers.   Consent I discussed the limitations, risks, security and privacy concerns of performing an evaluation and management service by telephone and the availability of in person appointments. I also discussed with the patient that there may be a patient responsible charge related to this service. The patient expressed understanding and agreed to proceed.   Location of Patient: Private Residence    Location of Provider: [facility]    Persons participating in Telemedicine visit: Bertram Denver FNP-BC YY Bien CMA Bently Gerke    History of Present Illness: Telemedicine visit for: F/U  Essential Hypertension Doing well. He does not monitor his blood pressure at home. Denies chest pain, shortness of breath, palpitations, lightheadedness, dizziness, headaches or BLE edema. Taking lisinopril 40 mg daily and amlodipine 40 mg daily as prescribed.  BP Readings from Last 3 Encounters:  02/27/19 (!) 159/94  11/25/18 (!) 148/89  11/02/18 130/80     Tobacco Dependence Cutting back. Last cigarette he smoked was 2-3 days.     Past Medical History:  Diagnosis Date  . Hyperlipidemia   . Hypertension 03/13/2016    Past Surgical History:  Procedure Laterality Date  . NO PAST SURGERIES       Family History  Problem Relation Age of Onset  . Diabetes Maternal Grandmother   . Hypertension Maternal Grandmother   . Colon cancer Neg Hx   . Colon polyps Neg Hx   . Rectal cancer Neg Hx   . Stomach cancer Neg Hx   . Esophageal cancer Neg Hx     Social History   Socioeconomic History  . Marital status: Single    Spouse name: Not on file  . Number of children: Not on file  . Years of education: Not on file  . Highest education level: Not on file  Occupational History  . Not on file  Tobacco Use  . Smoking status: Current Some Day Smoker  . Smokeless tobacco: Never Used  Substance and Sexual Activity  . Alcohol use: Yes    Comment: occ  . Drug use: No  . Sexual activity: Yes  Other Topics Concern  . Not on file  Social History Narrative  . Not on file   Social Determinants of Health   Financial Resource Strain:   . Difficulty of Paying Living Expenses:   Food Insecurity:   . Worried About Programme researcher, broadcasting/film/video in the Last Year:   . Barista in the Last Year:   Transportation Needs:   . Freight forwarder (Medical):   Marland Kitchen Lack of Transportation (Non-Medical):   Physical Activity:   . Days of Exercise per Week:   . Minutes of Exercise per Session:   Stress:   . Feeling of Stress :   Social Connections:   . Frequency of Communication  with Friends and Family:   . Frequency of Social Gatherings with Friends and Family:   . Attends Religious Services:   . Active Member of Clubs or Organizations:   . Attends Archivist Meetings:   Marland Kitchen Marital Status:      Observations/Objective: Awake, alert and oriented x 3   Review of Systems  Constitutional: Negative for fever, malaise/fatigue and weight loss.  HENT: Negative.  Negative for nosebleeds.   Eyes: Negative.  Negative for blurred vision, double vision and photophobia.  Respiratory: Negative.  Negative for cough and shortness of breath.   Cardiovascular: Negative.  Negative for chest  pain, palpitations and leg swelling.  Gastrointestinal: Positive for heartburn. Negative for nausea and vomiting.  Genitourinary:       ED  Musculoskeletal: Negative.  Negative for myalgias.  Neurological: Negative.  Negative for dizziness, focal weakness, seizures and headaches.  Psychiatric/Behavioral: Negative.  Negative for suicidal ideas.    Assessment and Plan: Finnigan was seen today for follow-up.  Diagnoses and all orders for this visit:  Essential hypertension Not well controlled.  -     amLODipine (NORVASC) 10 MG tablet; Take 1 tablet (10 mg total) by mouth daily. Please fill as a 90 day supply -     lisinopril (ZESTRIL) 40 MG tablet; Take 1 tablet (40 mg total) by mouth daily. . Please fill as a 90 day supply Continue all antihypertensives as prescribed.  Remember to bring in your blood pressure log with you for your follow up appointment.  DASH/Mediterranean Diets are healthier choices for HTN.    Tobacco dependence Jaheem was counseled on the dangers of tobacco use, and was advised to quit. Reviewed strategies to maximize success, including removing cigarettes and smoking materials from environment, stress management and support of family/friends as well as pharmacological alternatives including: Wellbutrin, Chantix, Nicotine patch, Nicotine gum or lozenges. Smoking cessation support: smoking cessation hotline: 1-800-QUIT-NOW.  Smoking cessation classes are also available through [facility]. Call [phone] or visit our website at [email or web address]   A total of 3 minutes was spent on counseling for smoking cessation and Abdurahman is ready to quit.   Mixed hyperlipidemia Not well controlled.  -     atorvastatin (LIPITOR) 80 MG tablet; Take 1 tablet (80 mg total) by mouth daily. Please fill as a 90 day supply INSTRUCTIONS: Work on a low fat, heart healthy diet and participate in regular aerobic exercise program by working out at least 150  minutes per week; 5 days a week-30 minutes per day. Avoid red meat/beef/steak,  fried foods. junk foods, sodas, sugary drinks, unhealthy snacking, alcohol and smoking.  Drink at least 80 oz of water per day and monitor your carbohydrate intake daily.  Lab Results  Component Value Date   LDLCALC 141 (H) 11/02/2018   GERD without esophagitis -     omeprazole (PRILOSEC) 20 MG capsule; Take 1 capsule (20 mg total) by mouth daily. Please fill as a 90 day supply INSTRUCTIONS: Avoid GERD Triggers: acidic, spicy or fried foods, caffeine, coffee, sodas,  alcohol and chocolate.   Erectile dysfunction, unspecified erectile dysfunction type -     tadalafil (CIALIS) 5 MG tablet; Take 2-4 tablets (10-20 mg total) by mouth daily as needed for erectile dysfunction.     Follow Up Instructions Return in about 3 months (around 08/04/2019).     I discussed the assessment and treatment plan with the patient. The patient was provided an opportunity to ask questions and all  were answered. The patient agreed with the plan and demonstrated an understanding of the instructions.   The patient was advised to call back or seek an in-person evaluation if the symptoms worsen or if the condition fails to improve as anticipated.  I provided 17 minutes of non-face-to-face time during this encounter including median intraservice time, reviewing previous notes, labs, imaging, medications and explaining diagnosis and management.  Gildardo Pounds, FNP-BC

## 2019-05-06 ENCOUNTER — Other Ambulatory Visit: Payer: Self-pay

## 2019-05-06 ENCOUNTER — Ambulatory Visit: Payer: BC Managed Care – PPO | Attending: Nurse Practitioner

## 2019-05-06 ENCOUNTER — Other Ambulatory Visit: Payer: Self-pay | Admitting: Nurse Practitioner

## 2019-05-06 DIAGNOSIS — I10 Essential (primary) hypertension: Secondary | ICD-10-CM | POA: Diagnosis not present

## 2019-05-06 DIAGNOSIS — D649 Anemia, unspecified: Secondary | ICD-10-CM

## 2019-05-06 DIAGNOSIS — E782 Mixed hyperlipidemia: Secondary | ICD-10-CM

## 2019-05-06 DIAGNOSIS — R7303 Prediabetes: Secondary | ICD-10-CM | POA: Diagnosis not present

## 2019-05-07 LAB — CBC
Hematocrit: 39.1 % (ref 37.5–51.0)
Hemoglobin: 12.4 g/dL — ABNORMAL LOW (ref 13.0–17.7)
MCH: 20.8 pg — ABNORMAL LOW (ref 26.6–33.0)
MCHC: 31.7 g/dL (ref 31.5–35.7)
MCV: 66 fL — ABNORMAL LOW (ref 79–97)
Platelets: 214 10*3/uL (ref 150–450)
RBC: 5.95 x10E6/uL — ABNORMAL HIGH (ref 4.14–5.80)
RDW: 18.5 % — ABNORMAL HIGH (ref 11.6–15.4)
WBC: 8 10*3/uL (ref 3.4–10.8)

## 2019-05-07 LAB — CMP14+EGFR
ALT: 31 IU/L (ref 0–44)
AST: 29 IU/L (ref 0–40)
Albumin/Globulin Ratio: 1.9 (ref 1.2–2.2)
Albumin: 5 g/dL — ABNORMAL HIGH (ref 3.8–4.8)
Alkaline Phosphatase: 103 IU/L (ref 39–117)
BUN/Creatinine Ratio: 9 — ABNORMAL LOW (ref 10–24)
BUN: 12 mg/dL (ref 8–27)
Bilirubin Total: 0.4 mg/dL (ref 0.0–1.2)
CO2: 23 mmol/L (ref 20–29)
Calcium: 9.7 mg/dL (ref 8.6–10.2)
Chloride: 105 mmol/L (ref 96–106)
Creatinine, Ser: 1.41 mg/dL — ABNORMAL HIGH (ref 0.76–1.27)
GFR calc Af Amer: 61 mL/min/{1.73_m2} (ref >59)
GFR calc non Af Amer: 53 mL/min/{1.73_m2} — ABNORMAL LOW (ref >59)
Globulin, Total: 2.6 g/dL (ref 1.5–4.5)
Glucose: 86 mg/dL (ref 65–99)
Potassium: 4.6 mmol/L (ref 3.5–5.2)
Sodium: 141 mmol/L (ref 134–144)
Total Protein: 7.6 g/dL (ref 6.0–8.5)

## 2019-05-07 LAB — LIPID PANEL
Chol/HDL Ratio: 3.4 ratio (ref 0.0–5.0)
Cholesterol, Total: 135 mg/dL (ref 100–199)
HDL: 40 mg/dL (ref >39)
LDL Chol Calc (NIH): 63 mg/dL (ref 0–99)
Triglycerides: 191 mg/dL — ABNORMAL HIGH (ref 0–149)
VLDL Cholesterol Cal: 32 mg/dL (ref 5–40)

## 2019-05-07 LAB — HEMOGLOBIN A1C
Est. average glucose Bld gHb Est-mCnc: 131 mg/dL
Hgb A1c MFr Bld: 6.2 % — ABNORMAL HIGH (ref 4.8–5.6)

## 2019-06-28 ENCOUNTER — Other Ambulatory Visit: Payer: Self-pay

## 2019-06-28 ENCOUNTER — Encounter: Payer: Self-pay | Admitting: Nurse Practitioner

## 2019-06-28 ENCOUNTER — Ambulatory Visit: Payer: BC Managed Care – PPO | Attending: Nurse Practitioner | Admitting: Nurse Practitioner

## 2019-06-28 VITALS — BP 153/92 | HR 79 | Temp 97.9°F | Ht 76.0 in | Wt 239.0 lb

## 2019-06-28 DIAGNOSIS — Z1159 Encounter for screening for other viral diseases: Secondary | ICD-10-CM

## 2019-06-28 DIAGNOSIS — K649 Unspecified hemorrhoids: Secondary | ICD-10-CM

## 2019-06-28 DIAGNOSIS — Z114 Encounter for screening for human immunodeficiency virus [HIV]: Secondary | ICD-10-CM | POA: Diagnosis not present

## 2019-06-28 DIAGNOSIS — F17200 Nicotine dependence, unspecified, uncomplicated: Secondary | ICD-10-CM

## 2019-06-28 DIAGNOSIS — N529 Male erectile dysfunction, unspecified: Secondary | ICD-10-CM

## 2019-06-28 DIAGNOSIS — R7303 Prediabetes: Secondary | ICD-10-CM | POA: Diagnosis not present

## 2019-06-28 DIAGNOSIS — I10 Essential (primary) hypertension: Secondary | ICD-10-CM

## 2019-06-28 LAB — GLUCOSE, POCT (MANUAL RESULT ENTRY): POC Glucose: 101 mg/dl — AB (ref 70–99)

## 2019-06-28 MED ORDER — CHLORTHALIDONE 25 MG PO TABS: 12.5000 mg | ORAL_TABLET | Freq: Every day | ORAL | 0 refills | Status: DC

## 2019-06-28 MED ORDER — TADALAFIL 5 MG PO TABS: 10.0000 mg | ORAL_TABLET | Freq: Every day | ORAL | 6 refills | Status: DC | PRN

## 2019-06-28 MED FILL — CHLORTHALIDONE 25 MG TAB: 25 | 30 days supply | Qty: 15 | Fill #0

## 2019-06-28 MED FILL — TADALAFIL 5 MG TABS: 5 | 20 days supply | Qty: 20 | Fill #0

## 2019-06-28 NOTE — Progress Notes (Signed)
Assessment & Plan:  Alexander Hunt was seen today for follow-up.  Diagnoses and all orders for this visit:  Essential hypertension -     chlorthalidone (HYGROTON) 25 MG tablet; Take 0.5 tablets (12.5 mg total) by mouth daily. Continue amlodipine and  as prescribed.  Remember to bring in your blood pressure log with you for your follow up appointment.  DASH/Mediterranean Diets are healthier choices for HTN.   Prediabetes -     Glucose (CBG)  Tobacco dependence Encouraged to quit smoking.  Declines smoking cessation treatment today.   Encounter for screening for HIV -     HIV antibody (with reflex)  Need for hepatitis C screening test -     Hepatitis C Antibody  Hemorrhoids, unspecified hemorrhoid type -     Ambulatory referral to Gastroenterology  Erectile dysfunction, unspecified erectile dysfunction type -     tadalafil (CIALIS) 5 MG tablet; Take 2-4 tablets (10-20 mg total) by mouth daily as needed for erectile dysfunction.    Patient has been counseled on age-appropriate routine health concerns for screening and prevention. These are reviewed and up-to-date. Referrals have been placed accordingly. Immunizations are up-to-date or declined.    Subjective:   Chief Complaint  Patient presents with  . Follow-up    Pt. is here for blood pressure follow up.    HPI Alexander Hunt 64 y.o. male presents to office today for follow up.  Essential Hypertension Taking amlodipine 10 mg and lisinopril 40 mg daily. .Denies chest pain, shortness of breath, palpitations, lightheadedness, dizziness, headaches or BLE edema.  BP Readings from Last 3 Encounters:  06/28/19 (!) 153/92  02/27/19 (!) 159/94  11/25/18 (!) 148/89    Hemorrhoids Declines physical exam today. Requesting referral for hemorrhoids and rectal pain after bowel movements. Denies blood in stools. Colonoscopy 06-2016: normal f/u 10 years.      Review of Systems  Constitutional: Negative for fever, malaise/fatigue  and weight loss.  HENT: Negative.  Negative for nosebleeds.   Eyes: Negative.  Negative for blurred vision, double vision and photophobia.  Respiratory: Negative.  Negative for cough and shortness of breath.   Cardiovascular: Negative.  Negative for chest pain, palpitations and leg swelling.  Gastrointestinal: Positive for constipation. Negative for abdominal pain, blood in stool, diarrhea, heartburn, melena, nausea and vomiting.  Genitourinary:       ED  Musculoskeletal: Negative.  Negative for myalgias.  Neurological: Negative.  Negative for dizziness, focal weakness, seizures and headaches.  Psychiatric/Behavioral: Negative.  Negative for suicidal ideas.    Past Medical History:  Diagnosis Date  . Hyperlipidemia   . Hypertension 03/13/2016    Past Surgical History:  Procedure Laterality Date  . NO PAST SURGERIES      Family History  Problem Relation Age of Onset  . Diabetes Maternal Grandmother   . Hypertension Maternal Grandmother   . Colon cancer Neg Hx   . Colon polyps Neg Hx   . Rectal cancer Neg Hx   . Stomach cancer Neg Hx   . Esophageal cancer Neg Hx     Social History Reviewed with no changes to be made today.   Outpatient Medications Prior to Visit  Medication Sig Dispense Refill  . amLODipine (NORVASC) 10 MG tablet Take 1 tablet (10 mg total) by mouth daily. Please fill as a 90 day supply 90 tablet 1  . atorvastatin (LIPITOR) 80 MG tablet Take 1 tablet (80 mg total) by mouth daily. Please fill as a 90 day supply 90 tablet 2  .  docusate sodium (COLACE) 100 MG capsule Take 1 capsule (100 mg total) by mouth 2 (two) times daily. Patient would like mailed to home. 90 capsule 3  . lisinopril (ZESTRIL) 40 MG tablet Take 1 tablet (40 mg total) by mouth daily. . Please fill as a 90 day supply 90 tablet 1  . omeprazole (PRILOSEC) 20 MG capsule Take 1 capsule (20 mg total) by mouth daily. Please fill as a 90 day supply 90 capsule 1  . tadalafil (CIALIS) 5 MG tablet Take  2-4 tablets (10-20 mg total) by mouth daily as needed for erectile dysfunction. 20 tablet 6  . erythromycin ophthalmic ointment Place a 1/2 inch ribbon of ointment into the lower eyelid 4 times daily (Patient not taking: Reported on 05/04/2019) 1 g 0   Facility-Administered Medications Prior to Visit  Medication Dose Route Frequency Provider Last Rate Last Admin  . 0.9 %  sodium chloride infusion  500 mL Intravenous Continuous Iva Boop, MD        No Known Allergies     Objective:    BP (!) 153/92 (BP Location: Left Arm, Patient Position: Sitting, Cuff Size: Large)   Pulse 79   Temp 97.9 F (36.6 C) (Temporal)   Ht 6\' 4"  (1.93 m)   Wt 239 lb (108.4 kg)   SpO2 98%   BMI 29.09 kg/m  Wt Readings from Last 3 Encounters:  06/28/19 239 lb (108.4 kg)  11/25/18 263 lb 14.4 oz (119.7 kg)  11/02/18 260 lb (117.9 kg)    Physical Exam Vitals and nursing note reviewed.  Constitutional:      Appearance: He is well-developed.  HENT:     Head: Normocephalic and atraumatic.  Cardiovascular:     Rate and Rhythm: Normal rate and regular rhythm.     Heart sounds: Normal heart sounds. No murmur. No friction rub. No gallop.   Pulmonary:     Effort: Pulmonary effort is normal. No tachypnea or respiratory distress.     Breath sounds: Normal breath sounds. No decreased breath sounds, wheezing, rhonchi or rales.  Chest:     Chest wall: No tenderness.  Abdominal:     General: Bowel sounds are normal.     Palpations: Abdomen is soft.  Musculoskeletal:        General: Normal range of motion.     Cervical back: Normal range of motion.  Skin:    General: Skin is warm and dry.  Neurological:     Mental Status: He is alert and oriented to person, place, and time.     Coordination: Coordination normal.  Psychiatric:        Behavior: Behavior normal. Behavior is cooperative.        Thought Content: Thought content normal.        Judgment: Judgment normal.          Patient has been  counseled extensively about nutrition and exercise as well as the importance of adherence with medications and regular follow-up. The patient was given clear instructions to go to ER or return to medical center if symptoms don't improve, worsen or new problems develop. The patient verbalized understanding.   Follow-up: Return in about 2 weeks (around 07/12/2019) for  BP RECHECK WITH LUKE AND BMP.   09/11/2019, FNP-BC [facility] [place], [place]   06/28/2019, 4:51 PM

## 2019-06-29 ENCOUNTER — Encounter: Payer: Self-pay | Admitting: Internal Medicine

## 2019-06-29 LAB — HEPATITIS C ANTIBODY: Hep C Virus Ab: <0.1 s/co ratio (ref 0.0–0.9)

## 2019-06-29 LAB — HIV ANTIBODY (ROUTINE TESTING W REFLEX): HIV Screen 4th Generation wRfx: NONREACTIVE

## 2019-07-13 ENCOUNTER — Ambulatory Visit: Payer: BC Managed Care – PPO | Admitting: Pharmacist

## 2019-07-22 ENCOUNTER — Ambulatory Visit: Payer: BC Managed Care – PPO | Admitting: Pharmacist

## 2019-08-12 ENCOUNTER — Ambulatory Visit: Payer: BC Managed Care – PPO | Admitting: Internal Medicine

## 2019-09-20 MED FILL — LISINOPRIL 40 MG TABLET: 40 | 30 days supply | Qty: 30 | Fill #3

## 2019-09-20 MED FILL — TADALAFIL 20 MG TABS: 20 | 30 days supply | Qty: 6 | Fill #3

## 2019-09-20 MED FILL — ATORVASTATIN 80 MG TABLET: 80 | 30 days supply | Qty: 30 | Fill #3

## 2019-09-20 MED FILL — AMLODIPINE BESYLATE 10 MG T: 10 | 30 days supply | Qty: 30 | Fill #3

## 2019-11-23 DIAGNOSIS — Z20822 Contact with and (suspected) exposure to covid-19: Secondary | ICD-10-CM | POA: Diagnosis not present

## 2019-12-08 ENCOUNTER — Other Ambulatory Visit: Payer: Self-pay | Admitting: Nurse Practitioner

## 2019-12-08 ENCOUNTER — Encounter: Payer: Self-pay | Admitting: Nurse Practitioner

## 2019-12-08 ENCOUNTER — Other Ambulatory Visit: Payer: Self-pay

## 2019-12-08 ENCOUNTER — Ambulatory Visit: Payer: BC Managed Care – PPO | Attending: Nurse Practitioner | Admitting: Nurse Practitioner

## 2019-12-08 VITALS — BP 154/80 | HR 71 | Temp 97.7°F | Ht 76.0 in | Wt 270.0 lb

## 2019-12-08 DIAGNOSIS — I10 Essential (primary) hypertension: Secondary | ICD-10-CM | POA: Diagnosis not present

## 2019-12-08 DIAGNOSIS — E782 Mixed hyperlipidemia: Secondary | ICD-10-CM

## 2019-12-08 DIAGNOSIS — D582 Other hemoglobinopathies: Secondary | ICD-10-CM | POA: Diagnosis not present

## 2019-12-08 DIAGNOSIS — K5909 Other constipation: Secondary | ICD-10-CM

## 2019-12-08 DIAGNOSIS — N529 Male erectile dysfunction, unspecified: Secondary | ICD-10-CM

## 2019-12-08 DIAGNOSIS — R7303 Prediabetes: Secondary | ICD-10-CM

## 2019-12-08 DIAGNOSIS — K219 Gastro-esophageal reflux disease without esophagitis: Secondary | ICD-10-CM

## 2019-12-08 LAB — POCT GLYCOSYLATED HEMOGLOBIN (HGB A1C): Hemoglobin A1C: 5.8 % — AB (ref 4.0–5.6)

## 2019-12-08 LAB — GLUCOSE, POCT (MANUAL RESULT ENTRY): POC Glucose: 123 mg/dl — AB (ref 70–99)

## 2019-12-08 MED ORDER — LISINOPRIL 40 MG PO TABS: 40.0000 mg | ORAL_TABLET | Freq: Every day | ORAL | 1 refills | Status: DC

## 2019-12-08 MED ORDER — CHLORTHALIDONE 25 MG PO TABS: 25.0000 mg | ORAL_TABLET | Freq: Every day | ORAL | 1 refills | Status: DC

## 2019-12-08 MED ORDER — ATORVASTATIN CALCIUM 80 MG PO TABS: 80.0000 mg | ORAL_TABLET | Freq: Every day | ORAL | 2 refills | Status: DC

## 2019-12-08 MED ORDER — AMLODIPINE BESYLATE 10 MG PO TABS: 10.0000 mg | ORAL_TABLET | Freq: Every day | ORAL | 1 refills | Status: DC

## 2019-12-08 MED ORDER — OMEPRAZOLE 20 MG PO CPDR: 20.0000 mg | DELAYED_RELEASE_CAPSULE | Freq: Every day | ORAL | 1 refills | Status: DC

## 2019-12-08 MED ORDER — TADALAFIL 5 MG PO TABS: 10.0000 mg | ORAL_TABLET | Freq: Every day | ORAL | 6 refills | Status: DC | PRN

## 2019-12-08 MED ORDER — DOCUSATE SODIUM 100 MG PO CAPS: 100.0000 mg | ORAL_CAPSULE | Freq: Two times a day (BID) | ORAL | 3 refills | Status: DC

## 2019-12-08 NOTE — Progress Notes (Signed)
Assessment & Plan:  Alexander Hunt was seen today for follow-up.  Diagnoses and all orders for this visit:  Essential hypertension -     lisinopril (ZESTRIL) 40 MG tablet; Take 1 tablet (40 mg total) by mouth daily. . Please fill as a 90 day supply. FOR BLOOD PRESSURE -     amLODipine (NORVASC) 10 MG tablet; Take 1 tablet (10 mg total) by mouth daily. Please fill as a 90 day supply. FOR BLOOD PRESSURE -     Discontinue: chlorthalidone (HYGROTON) 25 MG tablet; Take 1 tablet (25 mg total) by mouth daily. FOR BLOOD PRESSURE -     chlorthalidone (HYGROTON) 25 MG tablet; Take 1 tablet (25 mg total) by mouth daily. FOR BLOOD PRESSURE. Please fill as a 90 day supply Continue all antihypertensives as prescribed.  Remember to bring in your blood pressure log with you for your follow up appointment.  DASH/Mediterranean Diets are healthier choices for HTN.    Prediabetes -     Glucose (CBG) -     HgB A1c -     CMP14+EGFR Continue blood sugar control as discussed in office today, low carbohydrate diet, and regular physical exercise as tolerated, 150 minutes per week (30 min each day, 5 days per week, or 50 min 3 days per week).    Mixed hyperlipidemia -     atorvastatin (LIPITOR) 80 MG tablet; Take 1 tablet (80 mg total) by mouth daily. Please fill as a 90 day supply. FOR CHOLESTEROL INSTRUCTIONS: Work on a low fat, heart healthy diet and participate in regular aerobic exercise program by working out at least 150 minutes per week; 5 days a week-30 minutes per day. Avoid red meat/beef/steak,  fried foods. junk foods, sodas, sugary drinks, unhealthy snacking, alcohol and smoking.  Drink at least 80 oz of water per day and monitor your carbohydrate intake daily.    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  GERD without esophagitis -     omeprazole (PRILOSEC) 20 MG capsule; Take 1 capsule (20 mg total) by mouth daily. Please fill as a 90 day supply. FOR HEARTBURN INSTRUCTIONS: Avoid GERD Triggers: acidic, spicy or fried foods, caffeine, coffee, sodas,  alcohol and chocolate.   Chronic constipation -     docusate sodium (COLACE) 100 MG capsule; Take 1 capsule (100 mg total) by mouth 2 (two) times daily. FOR CONSTIPATION    Patient has been counseled on age-appropriate routine health concerns for screening and prevention. These are reviewed and up-to-date. Referrals have been placed accordingly. Immunizations are up-to-date or declined.    Subjective:   Chief Complaint  Patient presents with   Follow-up    Pt. is here for hypertension follow up.    HPI Alexander Hunt 64 y.o. male presents to office today for follow up.   has a past medical history of Hyperlipidemia and Hypertension (03/13/2016).   Essential Hypertension Blood pressure is elevated. He has not been taking chlorthalidone as he was not aware it was prescribed back in May. Currently only taking lisinopril 40 mg daily and amlodipine 10 mg daily. Denies chest pain, shortness of breath, palpitations, lightheadedness, dizziness, headaches or BLE edema.  BP Readings from Last 3 Encounters:  12/08/19 (!) 154/80  06/28/19 (!) 153/92  02/27/19 (!) 159/94   PREDIABETES Well controlled with diet at this time. LDL at goal with high intensity statin Lipitor 80 mg daily.  Lab Results  Component Value Date   HGBA1C 5.8 (A) 12/08/2019   Lab Results  Component Value Date   LDLCALC 63 05/06/2019   Review of Systems  Constitutional: Negative for fever, malaise/fatigue and weight loss.  HENT: Negative.  Negative for nosebleeds.   Eyes: Negative.  Negative for blurred vision, double vision and photophobia.  Respiratory: Negative.  Negative for cough and shortness of breath.   Cardiovascular: Negative.  Negative for  chest pain, palpitations and leg swelling.  Gastrointestinal: Positive for constipation and heartburn. Negative for nausea and vomiting.  Genitourinary: Negative for dysuria, flank pain, frequency, hematuria and urgency.       ED  Musculoskeletal: Negative.  Negative for myalgias.  Neurological: Negative.  Negative for dizziness, focal weakness, seizures and headaches.  Psychiatric/Behavioral: Negative.  Negative for suicidal ideas.    Past Medical History:  Diagnosis Date   Hyperlipidemia    Hypertension 03/13/2016    Past Surgical History:  Procedure Laterality Date   NO PAST SURGERIES      Family History  Problem Relation Age of Onset   Diabetes Maternal Grandmother    Hypertension Maternal Grandmother    Colon cancer Neg Hx    Colon polyps Neg Hx    Rectal cancer Neg Hx    Stomach cancer Neg Hx    Esophageal cancer Neg Hx     Social History Reviewed with no changes to be made today.   Outpatient Medications Prior to Visit  Medication Sig Dispense Refill   amLODipine (NORVASC) 10 MG tablet Take 1 tablet (10 mg total) by mouth daily. Please fill as a 90 day supply 90 tablet 1   atorvastatin (LIPITOR) 80 MG tablet Take 1 tablet (80 mg total) by mouth daily. Please fill as a 90 day supply 90 tablet 2   docusate sodium (COLACE) 100 MG capsule Take 1 capsule (100 mg total) by mouth 2 (two) times daily. Patient would like mailed to home. 90 capsule 3   lisinopril (ZESTRIL) 40 MG tablet Take 1 tablet (40 mg total) by mouth daily. . Please fill as a 90 day supply 90 tablet 1   tadalafil (CIALIS) 5 MG tablet Take 2-4 tablets (10-20 mg total) by mouth daily as needed for erectile dysfunction. 20 tablet 6   chlorthalidone (HYGROTON) 25 MG tablet Take 0.5 tablets (12.5 mg total) by mouth daily. 45 tablet 0   erythromycin ophthalmic ointment Place a 1/2 inch ribbon of ointment into the lower eyelid 4 times daily (Patient not taking: Reported on 05/04/2019) 1 g 0    omeprazole (PRILOSEC) 20 MG capsule Take 1 capsule (20 mg total) by mouth daily. Please fill as a 90 day supply 90 capsule 1   Facility-Administered Medications Prior to Visit  Medication Dose Route Frequency Provider Last Rate Last Admin   0.9 %  sodium chloride infusion  500 mL Intravenous Continuous Gatha Mayer, MD        No Known Allergies     Objective:    BP (!) 154/80 (BP Location: Left Arm, Patient Position: Sitting, Cuff Size: Normal)    Pulse 71    Temp 97.7 F (36.5 C) (Temporal)    Ht 6' 4"  (1.93 m)    Wt 270 lb (122.5 kg)    SpO2 100%    BMI 32.87 kg/m  Wt Readings from Last  3 Encounters:  12/08/19 270 lb (122.5 kg)  06/28/19 239 lb (108.4 kg)  11/25/18 263 lb 14.4 oz (119.7 kg)    Physical Exam       Patient has been counseled extensively about nutrition and exercise as well as the importance of adherence with medications and regular follow-up. The patient was given clear instructions to go to ER or return to medical center if symptoms don't improve, worsen or new problems develop. The patient verbalized understanding.   Follow-up: Return in about 3 weeks (around 12/29/2019) for BP CHECK WITH LUKE. See me in 3 months.   Gildardo Pounds, FNP-BC [facility] and [place], [place]   12/08/2019, 4:57 PM

## 2019-12-09 LAB — IRON,TIBC AND FERRITIN PANEL
Ferritin: 366 ng/mL (ref 30–400)
Iron Saturation: 33 % (ref 15–55)
Iron: 87 ug/dL (ref 38–169)
Total Iron Binding Capacity: 265 ug/dL (ref 250–450)
UIBC: 178 ug/dL (ref 111–343)

## 2019-12-09 LAB — CMP14+EGFR
ALT: 34 IU/L (ref 0–44)
AST: 31 IU/L (ref 0–40)
Albumin/Globulin Ratio: 1.8 (ref 1.2–2.2)
Albumin: 4.8 g/dL (ref 3.8–4.8)
Alkaline Phosphatase: 97 IU/L (ref 44–121)
BUN/Creatinine Ratio: 11 (ref 10–24)
BUN: 14 mg/dL (ref 8–27)
Bilirubin Total: 0.4 mg/dL (ref 0.0–1.2)
CO2: 25 mmol/L (ref 20–29)
Calcium: 9.8 mg/dL (ref 8.6–10.2)
Chloride: 104 mmol/L (ref 96–106)
Creatinine, Ser: 1.26 mg/dL (ref 0.76–1.27)
GFR calc Af Amer: 70 mL/min/{1.73_m2} (ref >59)
GFR calc non Af Amer: 60 mL/min/{1.73_m2} (ref >59)
Globulin, Total: 2.7 g/dL (ref 1.5–4.5)
Glucose: 119 mg/dL — ABNORMAL HIGH (ref 65–99)
Potassium: 4.9 mmol/L (ref 3.5–5.2)
Sodium: 140 mmol/L (ref 134–144)
Total Protein: 7.5 g/dL (ref 6.0–8.5)

## 2019-12-09 LAB — CBC
Hematocrit: 40.8 % (ref 37.5–51.0)
Hemoglobin: 12.7 g/dL — ABNORMAL LOW (ref 13.0–17.7)
MCH: 20.9 pg — ABNORMAL LOW (ref 26.6–33.0)
MCHC: 31.1 g/dL — ABNORMAL LOW (ref 31.5–35.7)
MCV: 67 fL — ABNORMAL LOW (ref 79–97)
Platelets: 189 10*3/uL (ref 150–450)
RBC: 6.07 x10E6/uL — ABNORMAL HIGH (ref 4.14–5.80)
RDW: 17.9 % — ABNORMAL HIGH (ref 11.6–15.4)
WBC: 7 10*3/uL (ref 3.4–10.8)

## 2019-12-09 MED FILL — LISINOPRIL 40 MG TABLET: 40 | 90 days supply | Qty: 90 | Fill #0

## 2019-12-09 MED FILL — ATORVASTATIN CALCIUM 80 MG: 80 | 90 days supply | Qty: 90 | Fill #0

## 2019-12-09 MED FILL — OMEPRAZOLE 20 MG CAP: 20 | 90 days supply | Qty: 90 | Fill #0

## 2019-12-09 MED FILL — AMLODIPINE BESYLATE 10 MG T: 10 | 90 days supply | Qty: 90 | Fill #0

## 2019-12-09 MED FILL — CHLORTHALIDONE 25 MG TAB: 25 | 90 days supply | Qty: 90 | Fill #0

## 2019-12-09 MED FILL — TADALAFIL 5 MG TABS: 5 | 30 days supply | Qty: 20 | Fill #0

## 2019-12-29 ENCOUNTER — Ambulatory Visit: Payer: BC Managed Care – PPO | Admitting: Pharmacist

## 2019-12-29 NOTE — Progress Notes (Unsigned)
   S:     PCP: Bertram Denver, NP  Patient arrives in good spirits. Presents to the clinic for hypertension evaluation, counseling, and management.  Patient was referred and last seen by Primary Care Provider on 12/08/19 at which BP was elevated at 154/80 and chlorthalidone 25 mg daily was started and all other HTN medications were continued.   Today, patient reports ***  has not been taking chlorthalidone as he was not aware it was prescribed back in May  Compliance? Took meds this morning? When do you take your meds? Dizziness, headaches, blurred vision? History of swelling? Check Clinic BP? Home BP logs? If no logs, bring to next visit w/ BP cuff Go over BP goals Additional BP therapy if needed . Cleda Daub - labs wnl . coreg Diet??  Exercise??   Medication adherence *** .  Current BP Medications include: amlodipine 10 mg daily, lisinopril 40 mg daily, chlorthalidone 25 mg daily  Antihypertensives tried in the past include: ***  Dietary habits include: *** Exercise habits include:*** Family / Social history:  -Fhx: HTN and DM in grandmother -Tobacco use: current some day smoker  ASCVD risk factors include:***   O:  Physical Exam  ROS  Home BP readings: ***  Last 3 Office BP readings: BP Readings from Last 3 Encounters:  12/08/19 (!) 154/80  06/28/19 (!) 153/92  02/27/19 (!) 159/94    BMET    Component Value Date/Time   NA 140 12/08/2019 1653   K 4.9 12/08/2019 1653   CL 104 12/08/2019 1653   CO2 25 12/08/2019 1653   GLUCOSE 119 (H) 12/08/2019 1653   GLUCOSE 115 (H) 04/24/2016 0949   BUN 14 12/08/2019 1653   CREATININE 1.26 12/08/2019 1653   CREATININE 1.55 (H) 04/24/2016 0949   CALCIUM 9.8 12/08/2019 1653   GFRNONAA 60 12/08/2019 1653   GFRNONAA 46 (L) 03/13/2016 1105   GFRAA 70 12/08/2019 1653   GFRAA 54 (L) 03/13/2016 1105    Renal function: CrCl cannot be calculated (Unknown ideal weight.).  Clinical ASCVD: {YES/NO:21197} The 10-year ASCVD  risk score Denman George DC Jr., et al., 2013) is: 31.7%   Values used to calculate the score:     Age: 64 years     Sex: Male     Is Non-Hispanic African American: Yes     Diabetic: No     Tobacco smoker: Yes     Systolic Blood Pressure: 154 mmHg     Is BP treated: Yes     HDL Cholesterol: 40 mg/dL     Total Cholesterol: 135 mg/dL  PHQ-2 Score: ***   A/P: Hypertension diagnosed *** currently *** on current medications. BP Goal = < *** mmHg. Medication adherence ***.  -{Meds adjust:18428} ***.  -F/u labs ordered - *** -Counseled on lifestyle modifications for blood pressure control including reduced dietary sodium, increased exercise, adequate sleep.  Results reviewed and written information provided.   Total time in face-to-face counseling *** minutes.   F/U Clinic Visit in ***.   Alexander Hunt, PharmD, BCPS PGY2 Ambulatory Care Resident [facility]  Pharmacy

## 2020-01-11 MED FILL — TADALAFIL 5 MG TABS: 5 | 30 days supply | Qty: 20 | Fill #1

## 2020-01-11 MED FILL — LISINOPRIL 40 MG TABLET: 40 | 90 days supply | Qty: 90 | Fill #0

## 2020-01-11 MED FILL — ATORVASTATIN CALCIUM 80 MG: 80 | 90 days supply | Qty: 90 | Fill #0

## 2020-01-11 MED FILL — AMLODIPINE BESYLATE 10 MG T: 10 | 90 days supply | Qty: 90 | Fill #0

## 2020-01-21 ENCOUNTER — Ambulatory Visit: Payer: BC Managed Care – PPO | Admitting: Pharmacist

## 2020-03-10 ENCOUNTER — Ambulatory Visit: Payer: BC Managed Care – PPO | Attending: Nurse Practitioner | Admitting: Nurse Practitioner

## 2020-03-10 ENCOUNTER — Encounter: Payer: Self-pay | Admitting: Nurse Practitioner

## 2020-03-10 ENCOUNTER — Other Ambulatory Visit: Payer: Self-pay

## 2020-03-10 DIAGNOSIS — R7303 Prediabetes: Secondary | ICD-10-CM

## 2020-03-10 DIAGNOSIS — I10 Essential (primary) hypertension: Secondary | ICD-10-CM | POA: Diagnosis not present

## 2020-03-10 DIAGNOSIS — E782 Mixed hyperlipidemia: Secondary | ICD-10-CM | POA: Diagnosis not present

## 2020-03-10 NOTE — Progress Notes (Signed)
Virtual Visit via Telephone Note Due to national recommendations of social distancing due to COVID 19, telehealth visit is felt to be most appropriate for this patient at this time.  I discussed the limitations, risks, security and privacy concerns of performing an evaluation and management service by telephone and the availability of in person appointments. I also discussed with the patient that there may be a patient responsible charge related to this service. The patient expressed understanding and agreed to proceed.    I connected with Alexander Hunt on 03/10/20  at   4:10 PM EST  EDT by telephone and verified that I am speaking with the correct person using two identifiers.   Consent I discussed the limitations, risks, security and privacy concerns of performing an evaluation and management service by telephone and the availability of in person appointments. I also discussed with the patient that there may be a patient responsible charge related to this service. The patient expressed understanding and agreed to proceed.   Location of Patient: [place]   Location of Provider: [facility]    Persons participating in Telemedicine visit: Alexander Denver FNP-BC YY Bien CMA Keron Finfrock    History of Present Illness: Telemedicine visit for: Follow Up  He has a past medical history of Hyperlipidemia and Hypertension (03/13/2016).   Essential Hypertension Not well controlled.  He endorses adherence taking chlorthalidone 25 mg daily, lisinopril 40 mg daily and amlodipine 10 mg daily as prescribed.  Denies chest pain, shortness of breath, palpitations, lightheadedness, dizziness, headaches or BLE edema.  I have recommended that he purchase a home blood pressure monitor so that he can check his blood pressure at home.  He does continue to smoke cigarettes. BP Readings from Last 3 Encounters:  12/08/19 (!) 154/80  06/28/19 (!) 153/92  02/27/19 (!) 159/94    Prediabetes  Well-controlled with diet only at this time.  LDL at goal with atorvastatin 80 mg daily. Lab Results  Component Value Date   HGBA1C 5.8 (A) 12/08/2019   Lab Results  Component Value Date   LDLCALC 63 05/06/2019     Past Medical History:  Diagnosis Date  . Hyperlipidemia   . Hypertension 03/13/2016    Past Surgical History:  Procedure Laterality Date  . NO PAST SURGERIES      Family History  Problem Relation Age of Onset  . Diabetes Maternal Grandmother   . Hypertension Maternal Grandmother   . Colon cancer Neg Hx   . Colon polyps Neg Hx   . Rectal cancer Neg Hx   . Stomach cancer Neg Hx   . Esophageal cancer Neg Hx     Social History   Socioeconomic History  . Marital status: Single    Spouse name: Not on file  . Number of children: Not on file  . Years of education: Not on file  . Highest education level: Not on file  Occupational History  . Not on file  Tobacco Use  . Smoking status: Current Some Day Smoker  . Smokeless tobacco: Never Used  Vaping Use  . Vaping Use: Never used  Substance and Sexual Activity  . Alcohol use: Yes    Comment: occ  . Drug use: No  . Sexual activity: Yes  Other Topics Concern  . Not on file  Social History Narrative  . Not on file   Social Determinants of Health   Financial Resource Strain: Not on file  Food Insecurity: Not on file  Transportation Needs:  Not on file  Physical Activity: Not on file  Stress: Not on file  Social Connections: Not on file     Observations/Objective: Awake, alert and oriented x 3   Review of Systems  Constitutional: Negative for fever, malaise/fatigue and weight loss.  HENT: Negative.  Negative for nosebleeds.   Eyes: Negative.  Negative for blurred vision, double vision and photophobia.  Respiratory: Negative.  Negative for cough and shortness of breath.   Cardiovascular: Negative.  Negative for chest pain, palpitations and leg swelling.  Gastrointestinal: Negative.   Negative for heartburn, nausea and vomiting.  Musculoskeletal: Negative.  Negative for myalgias.  Neurological: Negative.  Negative for dizziness, focal weakness, seizures and headaches.  Psychiatric/Behavioral: Negative.  Negative for suicidal ideas.    Assessment and Plan: [name] was seen today for follow-up.  Diagnoses and all orders for this visit:  Essential hypertension  Mixed hyperlipidemia  Prediabetes     Follow Up Instructions No follow-ups on file.     I discussed the assessment and treatment plan with the patient. The patient was provided an opportunity to ask questions and all were answered. The patient agreed with the plan and demonstrated an understanding of the instructions.   The patient was advised to call back or seek an in-person evaluation if the symptoms worsen or if the condition fails to improve as anticipated.  I provided 12 minutes of non-face-to-face time during this encounter including median intraservice time, reviewing previous notes, labs, imaging, medications and explaining diagnosis and management.  Claiborne Rigg, FNP-BC

## 2020-03-14 ENCOUNTER — Ambulatory Visit: Payer: BC Managed Care – PPO | Attending: Nurse Practitioner

## 2020-03-14 ENCOUNTER — Other Ambulatory Visit: Payer: Self-pay | Admitting: Nurse Practitioner

## 2020-03-14 ENCOUNTER — Other Ambulatory Visit: Payer: Self-pay

## 2020-03-14 DIAGNOSIS — D582 Other hemoglobinopathies: Secondary | ICD-10-CM

## 2020-03-14 DIAGNOSIS — R7303 Prediabetes: Secondary | ICD-10-CM | POA: Diagnosis not present

## 2020-03-14 DIAGNOSIS — I10 Essential (primary) hypertension: Secondary | ICD-10-CM

## 2020-03-14 DIAGNOSIS — E782 Mixed hyperlipidemia: Secondary | ICD-10-CM

## 2020-03-15 LAB — CBC
Hematocrit: 38.9 % (ref 37.5–51.0)
Hemoglobin: 12.7 g/dL — ABNORMAL LOW (ref 13.0–17.7)
MCH: 21.3 pg — ABNORMAL LOW (ref 26.6–33.0)
MCHC: 32.6 g/dL (ref 31.5–35.7)
MCV: 65 fL — ABNORMAL LOW (ref 79–97)
Platelets: 207 10*3/uL (ref 150–450)
RBC: 5.95 x10E6/uL — ABNORMAL HIGH (ref 4.14–5.80)
RDW: 18 % — ABNORMAL HIGH (ref 11.6–15.4)
WBC: 7.4 10*3/uL (ref 3.4–10.8)

## 2020-03-15 LAB — CMP14+EGFR
ALT: 34 IU/L (ref 0–44)
AST: 28 IU/L (ref 0–40)
Albumin/Globulin Ratio: 1.7 (ref 1.2–2.2)
Albumin: 4.7 g/dL (ref 3.8–4.8)
Alkaline Phosphatase: 117 IU/L (ref 44–121)
BUN/Creatinine Ratio: 11 (ref 10–24)
BUN: 15 mg/dL (ref 8–27)
Bilirubin Total: 0.4 mg/dL (ref 0.0–1.2)
CO2: 25 mmol/L (ref 20–29)
Calcium: 9.6 mg/dL (ref 8.6–10.2)
Chloride: 103 mmol/L (ref 96–106)
Creatinine, Ser: 1.38 mg/dL — ABNORMAL HIGH (ref 0.76–1.27)
GFR calc Af Amer: 62 mL/min/{1.73_m2} (ref >59)
GFR calc non Af Amer: 54 mL/min/{1.73_m2} — ABNORMAL LOW (ref >59)
Globulin, Total: 2.7 g/dL (ref 1.5–4.5)
Glucose: 118 mg/dL — ABNORMAL HIGH (ref 65–99)
Potassium: 4.9 mmol/L (ref 3.5–5.2)
Sodium: 138 mmol/L (ref 134–144)
Total Protein: 7.4 g/dL (ref 6.0–8.5)

## 2020-03-15 LAB — LIPID PANEL
Chol/HDL Ratio: 3.6 ratio (ref 0.0–5.0)
Cholesterol, Total: 127 mg/dL (ref 100–199)
HDL: 35 mg/dL — ABNORMAL LOW (ref >39)
LDL Chol Calc (NIH): 63 mg/dL (ref 0–99)
Triglycerides: 169 mg/dL — ABNORMAL HIGH (ref 0–149)
VLDL Cholesterol Cal: 29 mg/dL (ref 5–40)

## 2020-03-15 LAB — HEMOGLOBIN A1C
Est. average glucose Bld gHb Est-mCnc: 134 mg/dL
Hgb A1c MFr Bld: 6.3 % — ABNORMAL HIGH (ref 4.8–5.6)

## 2020-04-24 MED FILL — LISINOPRIL 40 MG TAB: 40 | 30 days supply | Qty: 30 | Fill #1

## 2020-04-24 MED FILL — ATORVASTATIN CALCIUM 80 MG: 80 | 30 days supply | Qty: 30 | Fill #1

## 2020-04-24 MED FILL — AMLODIPINE BESYLATE 10 MG T: 10 | 30 days supply | Qty: 30 | Fill #0

## 2020-04-24 MED FILL — TADALAFIL 5 MG TABS: 5 | 30 days supply | Qty: 20 | Fill #2

## 2020-05-01 ENCOUNTER — Ambulatory Visit: Payer: BC Managed Care – PPO | Admitting: Nurse Practitioner

## 2020-05-24 ENCOUNTER — Other Ambulatory Visit: Payer: Self-pay

## 2020-05-24 MED FILL — Omeprazole Cap Delayed Release 20 MG: ORAL | 90 days supply | Qty: 90 | Fill #0 | Status: AC

## 2020-07-11 ENCOUNTER — Other Ambulatory Visit: Payer: Self-pay

## 2020-07-11 MED FILL — Tadalafil Tab 5 MG: ORAL | 30 days supply | Qty: 10 | Fill #0 | Status: AC

## 2020-07-11 MED FILL — Lisinopril Tab 40 MG: ORAL | 30 days supply | Qty: 30 | Fill #0 | Status: AC

## 2020-07-11 MED FILL — Amlodipine Besylate Tab 10 MG (Base Equivalent): ORAL | 30 days supply | Qty: 30 | Fill #0 | Status: AC

## 2020-07-11 MED FILL — Atorvastatin Calcium Tab 80 MG (Base Equivalent): ORAL | 30 days supply | Qty: 30 | Fill #0 | Status: AC

## 2020-07-12 ENCOUNTER — Other Ambulatory Visit: Payer: Self-pay

## 2020-08-01 ENCOUNTER — Other Ambulatory Visit: Payer: Self-pay

## 2020-08-01 ENCOUNTER — Encounter: Payer: Self-pay | Admitting: Nurse Practitioner

## 2020-08-01 ENCOUNTER — Ambulatory Visit: Payer: BC Managed Care – PPO | Attending: Nurse Practitioner | Admitting: Nurse Practitioner

## 2020-08-01 VITALS — BP 146/77 | HR 77 | Ht 76.0 in | Wt 274.0 lb

## 2020-08-01 DIAGNOSIS — R7303 Prediabetes: Secondary | ICD-10-CM

## 2020-08-01 DIAGNOSIS — D582 Other hemoglobinopathies: Secondary | ICD-10-CM

## 2020-08-01 DIAGNOSIS — I10 Essential (primary) hypertension: Secondary | ICD-10-CM

## 2020-08-01 MED ORDER — CHLORTHALIDONE 25 MG PO TABS
25.0000 mg | ORAL_TABLET | Freq: Every day | ORAL | 1 refills | Status: DC
  Filled 2020-08-01: qty 90, 90d supply, fill #0

## 2020-08-01 NOTE — Addendum Note (Signed)
Addended by: Bertram Denver on: 08/01/2020 04:22 PM   Modules accepted: Orders

## 2020-08-01 NOTE — Progress Notes (Signed)
Assessment & Plan:  Alexander Hunt was seen today for hypertension.  Diagnoses and all orders for this visit:  Primary hypertension -     CMP14+EGFR Continue all antihypertensives as prescribed.  Remember to bring in your blood pressure log with you for your follow up appointment.  DASH/Mediterranean Diets are healthier choices for HTN.    Prediabetes -     Hemoglobin A1c Continue blood sugar control as discussed in office today, low carbohydrate diet, and regular physical exercise as tolerated, 150 minutes per week (30 min each day, 5 days per week, or 50 min 3 days per week).   Hemoglobinopathy [facility]) -     CBC   Patient has been counseled on age-appropriate routine health concerns for screening and prevention. These are reviewed and up-to-date. Referrals have been placed accordingly. Immunizations are up-to-date or declined.    Subjective:   Chief Complaint  Patient presents with   Hypertension    Hypertension Pertinent negatives include no blurred vision, chest pain, headaches, malaise/fatigue, palpitations or shortness of breath.  Alexander Hunt 65 y.o. male presents to office today for follow up.  has a past medical history of Hyperlipidemia, Hypertension (03/13/2016), and Prediabetes.    Essential Hypertension Blood pressure is slightly elevated. He is unsure which medication he is not taking but it appears to be chlorthalidone as it is expired on his medication list. He will bring his medications to the office tomorrow for review. Will not make any changes today at this time. Denies chest pain, shortness of breath, palpitations, lightheadedness, dizziness, headaches or BLE edema.  He is currently prescribed amlodipine 10 mg daily, chlorthalidone 25 mg daily, lisinopril 40 mg daily.  BP Readings from Last 3 Encounters:  08/01/20 (!) 146/77  12/08/19 (!) 154/80  06/28/19 (!) 153/92    Prediabetes Well controlled without the use of any oral diabetic agents.  Lab Results   Component Value Date   HGBA1C 6.3 (H) 03/14/2020    Review of Systems  Constitutional:  Negative for fever, malaise/fatigue and weight loss.  HENT: Negative.  Negative for nosebleeds.   Eyes: Negative.  Negative for blurred vision, double vision and photophobia.  Respiratory: Negative.  Negative for cough and shortness of breath.   Cardiovascular: Negative.  Negative for chest pain, palpitations and leg swelling.  Gastrointestinal: Negative.  Negative for heartburn, nausea and vomiting.  Musculoskeletal: Negative.  Negative for myalgias.  Neurological: Negative.  Negative for dizziness, focal weakness, seizures and headaches.  Psychiatric/Behavioral: Negative.  Negative for suicidal ideas.    Past Medical History:  Diagnosis Date   Hyperlipidemia    Hypertension 03/13/2016   Prediabetes     Past Surgical History:  Procedure Laterality Date   NO PAST SURGERIES      Family History  Problem Relation Age of Onset   Diabetes Maternal Grandmother    Hypertension Maternal Grandmother    Colon cancer Neg Hx    Colon polyps Neg Hx    Rectal cancer Neg Hx    Stomach cancer Neg Hx    Esophageal cancer Neg Hx     Social History Reviewed with no changes to be made today.   Outpatient Medications Prior to Visit  Medication Sig Dispense Refill   amLODipine (NORVASC) 10 MG tablet TAKE 1 TABLET (10 MG TOTAL) BY MOUTH DAILY. 90 tablet 1   atorvastatin (LIPITOR) 80 MG tablet TAKE 1 TABLET (80 MG TOTAL) BY MOUTH DAILY. 90 tablet 2   lisinopril (ZESTRIL) 40 MG tablet TAKE 1 TABLET (  40 MG TOTAL) BY MOUTH DAILY. 90 tablet 1   omeprazole (PRILOSEC) 20 MG capsule TAKE 1 CAPSULE (20 MG TOTAL) BY MOUTH DAILY. PLEASE FILL AS A 90 DAY SUPPLY. FOR HEARTBURN 90 capsule 1   chlorthalidone (HYGROTON) 25 MG tablet Take 1 tablet (25 mg total) by mouth daily. FOR BLOOD PRESSURE. Please fill as a 90 day supply 90 tablet 1   tadalafil (CIALIS) 5 MG tablet TAKE 2-4 TABLETS (10-20 MG TOTAL) BY MOUTH DAILY AS  NEEDED FOR ERECTILE DYSFUNCTION. (Patient not taking: Reported on 08/01/2020) 20 tablet 6   docusate sodium (COLACE) 100 MG capsule Take 1 capsule (100 mg total) by mouth 2 (two) times daily. FOR CONSTIPATION (Patient not taking: Reported on 08/01/2020) 90 capsule 3   Facility-Administered Medications Prior to Visit  Medication Dose Route Frequency Provider Last Rate Last Admin   0.9 %  sodium chloride infusion  500 mL Intravenous Continuous Gatha Mayer, MD        No Known Allergies     Objective:    BP (!) 146/77   Pulse 77   Ht 6' 4"  (1.93 m)   Wt 274 lb (124.3 kg)   SpO2 97%   BMI 33.35 kg/m  Wt Readings from Last 3 Encounters:  08/01/20 274 lb (124.3 kg)  12/08/19 270 lb (122.5 kg)  06/28/19 239 lb (108.4 kg)    Physical Exam Vitals and nursing note reviewed.  Constitutional:      Appearance: He is well-developed.  HENT:     Head: Normocephalic and atraumatic.  Cardiovascular:     Rate and Rhythm: Normal rate and regular rhythm.     Heart sounds: Normal heart sounds. No murmur heard.   No friction rub. No gallop.  Pulmonary:     Effort: Pulmonary effort is normal. No tachypnea or respiratory distress.     Breath sounds: Normal breath sounds. No decreased breath sounds, wheezing, rhonchi or rales.  Chest:     Chest wall: No tenderness.  Abdominal:     General: Bowel sounds are normal.     Palpations: Abdomen is soft.  Musculoskeletal:        General: Normal range of motion.     Cervical back: Normal range of motion.  Skin:    General: Skin is warm and dry.  Neurological:     Mental Status: He is alert and oriented to person, place, and time.     Coordination: Coordination normal.  Psychiatric:        Behavior: Behavior normal. Behavior is cooperative.        Thought Content: Thought content normal.        Judgment: Judgment normal.         Patient has been counseled extensively about nutrition and exercise as well as the importance of adherence with  medications and regular follow-up. The patient was given clear instructions to go to ER or return to medical center if symptoms don't improve, worsen or new problems develop. The patient verbalized understanding.   Follow-up: Return in about 3 months (around 11/01/2020).   Gildardo Pounds, FNP-BC [facility] and [facility] [place], [address]   08/01/2020, 4:10 PM

## 2020-08-02 ENCOUNTER — Other Ambulatory Visit: Payer: Self-pay

## 2020-08-02 LAB — CMP14+EGFR
ALT: 31 IU/L (ref 0–44)
AST: 20 IU/L (ref 0–40)
Albumin/Globulin Ratio: 1.8 (ref 1.2–2.2)
Albumin: 4.9 g/dL — ABNORMAL HIGH (ref 3.8–4.8)
Alkaline Phosphatase: 113 IU/L (ref 44–121)
BUN/Creatinine Ratio: 10 (ref 10–24)
BUN: 14 mg/dL (ref 8–27)
Bilirubin Total: 0.4 mg/dL (ref 0.0–1.2)
CO2: 25 mmol/L (ref 20–29)
Calcium: 9.9 mg/dL (ref 8.6–10.2)
Chloride: 98 mmol/L (ref 96–106)
Creatinine, Ser: 1.36 mg/dL — ABNORMAL HIGH (ref 0.76–1.27)
Globulin, Total: 2.8 g/dL (ref 1.5–4.5)
Glucose: 96 mg/dL (ref 65–99)
Potassium: 4.6 mmol/L (ref 3.5–5.2)
Sodium: 137 mmol/L (ref 134–144)
Total Protein: 7.7 g/dL (ref 6.0–8.5)
eGFR: 58 mL/min/{1.73_m2} — ABNORMAL LOW (ref >59)

## 2020-08-02 LAB — CBC
Hematocrit: 39.9 % (ref 37.5–51.0)
Hemoglobin: 12.7 g/dL — ABNORMAL LOW (ref 13.0–17.7)
MCH: 20.6 pg — ABNORMAL LOW (ref 26.6–33.0)
MCHC: 31.8 g/dL (ref 31.5–35.7)
MCV: 65 fL — ABNORMAL LOW (ref 79–97)
Platelets: 222 10*3/uL (ref 150–450)
RBC: 6.17 x10E6/uL — ABNORMAL HIGH (ref 4.14–5.80)
RDW: 17.2 % — ABNORMAL HIGH (ref 11.6–15.4)
WBC: 7.5 10*3/uL (ref 3.4–10.8)

## 2020-08-02 LAB — HEMOGLOBIN A1C
Est. average glucose Bld gHb Est-mCnc: 146 mg/dL
Hgb A1c MFr Bld: 6.7 % — ABNORMAL HIGH (ref 4.8–5.6)

## 2020-08-04 ENCOUNTER — Other Ambulatory Visit: Payer: Self-pay

## 2020-09-18 ENCOUNTER — Other Ambulatory Visit: Payer: Self-pay

## 2020-09-18 MED FILL — Tadalafil Tab 5 MG: ORAL | 30 days supply | Qty: 10 | Fill #1 | Status: AC

## 2020-09-18 MED FILL — Amlodipine Besylate Tab 10 MG (Base Equivalent): ORAL | 30 days supply | Qty: 30 | Fill #1 | Status: AC

## 2020-09-18 MED FILL — Atorvastatin Calcium Tab 80 MG (Base Equivalent): ORAL | 30 days supply | Qty: 30 | Fill #1 | Status: AC

## 2020-09-19 ENCOUNTER — Other Ambulatory Visit: Payer: Self-pay

## 2020-11-01 ENCOUNTER — Ambulatory Visit: Payer: BC Managed Care – PPO | Attending: Nurse Practitioner | Admitting: Nurse Practitioner

## 2020-11-01 ENCOUNTER — Other Ambulatory Visit: Payer: Self-pay

## 2020-11-01 ENCOUNTER — Encounter: Payer: Self-pay | Admitting: Nurse Practitioner

## 2020-11-01 DIAGNOSIS — N529 Male erectile dysfunction, unspecified: Secondary | ICD-10-CM | POA: Diagnosis not present

## 2020-11-01 DIAGNOSIS — E782 Mixed hyperlipidemia: Secondary | ICD-10-CM

## 2020-11-01 DIAGNOSIS — K219 Gastro-esophageal reflux disease without esophagitis: Secondary | ICD-10-CM

## 2020-11-01 DIAGNOSIS — I10 Essential (primary) hypertension: Secondary | ICD-10-CM

## 2020-11-01 MED ORDER — AMLODIPINE BESYLATE 10 MG PO TABS
ORAL_TABLET | Freq: Every day | ORAL | 1 refills | Status: DC
  Filled 2020-11-01 – 2020-12-13 (×2): qty 90, 90d supply, fill #0

## 2020-11-01 MED ORDER — ATORVASTATIN CALCIUM 80 MG PO TABS
ORAL_TABLET | Freq: Every day | ORAL | 2 refills | Status: DC
  Filled 2020-11-01 – 2020-11-15 (×2): qty 90, 90d supply, fill #0

## 2020-11-01 MED ORDER — CHLORTHALIDONE 25 MG PO TABS
25.0000 mg | ORAL_TABLET | Freq: Every day | ORAL | 1 refills | Status: DC
  Filled 2020-11-01: qty 90, 90d supply, fill #0

## 2020-11-01 MED ORDER — OMEPRAZOLE 20 MG PO CPDR
DELAYED_RELEASE_CAPSULE | ORAL | 1 refills | Status: DC
  Filled 2020-11-01 – 2020-11-15 (×2): qty 90, 90d supply, fill #0

## 2020-11-01 MED ORDER — TADALAFIL 5 MG PO TABS
ORAL_TABLET | ORAL | 6 refills | Status: DC
  Filled 2020-11-01: qty 10, 30d supply, fill #0
  Filled 2020-11-15: qty 20, 20d supply, fill #0

## 2020-11-01 NOTE — Progress Notes (Addendum)
Assessment & Plan:  Alexander Hunt was seen today for hypertension.  Diagnoses and all orders for this visit:  Essential hypertension -     amLODipine (NORVASC) 10 MG tablet; TAKE 1 TABLET (10 MG TOTAL) BY MOUTH DAILY. -     chlorthalidone (HYGROTON) 25 MG tablet; Take 1 tablet (25 mg total) by mouth daily. FOR BLOOD PRESSURE. Please fill as a 90 day supply Continue all antihypertensives as prescribed.  Remember to bring in your blood pressure log with you for your follow up appointment.  DASH/Mediterranean Diets are healthier choices for HTN.    Mixed hyperlipidemia -     atorvastatin (LIPITOR) 80 MG tablet; TAKE 1 TABLET (80 MG TOTAL) BY MOUTH DAILY. INSTRUCTIONS: Work on a low fat, heart healthy diet and participate in regular aerobic exercise program by working out at least 150 minutes per week; 5 days a week-30 minutes per day. Avoid red meat/beef/steak,  fried foods. junk foods, sodas, sugary drinks, unhealthy snacking, alcohol and smoking.  Drink at least 80 oz of water per day and monitor your carbohydrate intake daily.    GERD without esophagitis -     omeprazole (PRILOSEC) 20 MG capsule; TAKE 1 CAPSULE (20 MG TOTAL) BY MOUTH DAILY. PLEASE FILL AS A 90 DAY SUPPLY. FOR HEARTBURN INSTRUCTIONS: Avoid GERD Triggers: acidic, spicy or fried foods, caffeine, coffee, sodas,  alcohol and chocolate.    Erectile dysfunction, unspecified erectile dysfunction type -     tadalafil (CIALIS) 5 MG tablet; TAKE 2-4 TABLETS (10-20 MG TOTAL) BY MOUTH DAILY AS NEEDED FOR ERECTILE DYSFUNCTION.    Patient has been counseled on age-appropriate routine health concerns for screening and prevention. These are reviewed and up-to-date. Referrals have been placed accordingly. Immunizations are up-to-date or declined.    Subjective:   Chief Complaint  Patient presents with   Hypertension   HPI Alexander Hunt 65 y.o. male presents to office today for follow up to HTN. He has a past medical history of  Hyperlipidemia, Hypertension (03/13/2016), and Prediabetes.    Hypertension Blood pressure is well controlled. Taking amlodipine 10 mg daily and chlorthalidone 25 mg daily. Denies chest pain, shortness of breath, palpitations, lightheadedness, dizziness, headaches or BLE edema.   BP Readings from Last 3 Encounters:  11/01/20 117/76  08/01/20 (!) 146/77  12/08/19 (!) 154/80    Dyslipidemia LDL at goal with atorvastatin 80 mg daily. Denies any lipid intolerance.  Lab Results  Component Value Date   LDLCALC 63 03/14/2020    GERD Taking prilosec 20 mg daily as prescribed. Denies any GERD symptoms.    Review of Systems  Constitutional:  Negative for fever, malaise/fatigue and weight loss.  HENT: Negative.  Negative for nosebleeds.   Eyes: Negative.  Negative for blurred vision, double vision and photophobia.  Respiratory: Negative.  Negative for cough and shortness of breath.   Cardiovascular: Negative.  Negative for chest pain, palpitations and leg swelling.  Gastrointestinal: Negative.  Negative for heartburn, nausea and vomiting.  Musculoskeletal: Negative.  Negative for myalgias.  Neurological: Negative.  Negative for dizziness, focal weakness, seizures and headaches.  Psychiatric/Behavioral: Negative.  Negative for suicidal ideas.    Past Medical History:  Diagnosis Date   Hyperlipidemia    Hypertension 03/13/2016   Prediabetes     Past Surgical History:  Procedure Laterality Date   NO PAST SURGERIES      Family History  Problem Relation Age of Onset   Diabetes Maternal Grandmother    Hypertension Maternal Grandmother  Colon cancer Neg Hx    Colon polyps Neg Hx    Rectal cancer Neg Hx    Stomach cancer Neg Hx    Esophageal cancer Neg Hx     Social History Reviewed with no changes to be made today.   Outpatient Medications Prior to Visit  Medication Sig Dispense Refill   amLODipine (NORVASC) 10 MG tablet TAKE 1 TABLET (10 MG TOTAL) BY MOUTH DAILY. 90 tablet  1   atorvastatin (LIPITOR) 80 MG tablet TAKE 1 TABLET (80 MG TOTAL) BY MOUTH DAILY. 90 tablet 2   chlorthalidone (HYGROTON) 25 MG tablet Take 1 tablet (25 mg total) by mouth daily. FOR BLOOD PRESSURE. Please fill as a 90 day supply 90 tablet 1   omeprazole (PRILOSEC) 20 MG capsule TAKE 1 CAPSULE (20 MG TOTAL) BY MOUTH DAILY. PLEASE FILL AS A 90 DAY SUPPLY. FOR HEARTBURN 90 capsule 1   tadalafil (CIALIS) 5 MG tablet TAKE 2-4 TABLETS (10-20 MG TOTAL) BY MOUTH DAILY AS NEEDED FOR ERECTILE DYSFUNCTION. 20 tablet 6   lisinopril (ZESTRIL) 40 MG tablet TAKE 1 TABLET (40 MG TOTAL) BY MOUTH DAILY. (Patient not taking: Reported on 11/01/2020) 90 tablet 1   Facility-Administered Medications Prior to Visit  Medication Dose Route Frequency Provider Last Rate Last Admin   0.9 %  sodium chloride infusion  500 mL Intravenous Continuous Iva Boop, MD        No Known Allergies     Objective:    BP 117/76   Pulse 80   Ht 6\' 4"  (1.93 m)   Wt 261 lb 8 oz (118.6 kg)   SpO2 99%   BMI 31.83 kg/m  Wt Readings from Last 3 Encounters:  11/01/20 261 lb 8 oz (118.6 kg)  08/01/20 274 lb (124.3 kg)  12/08/19 270 lb (122.5 kg)    Physical Exam Vitals and nursing note reviewed.  Constitutional:      Appearance: He is well-developed.  HENT:     Head: Normocephalic and atraumatic.  Cardiovascular:     Rate and Rhythm: Normal rate and regular rhythm.     Heart sounds: Normal heart sounds. No murmur heard.   No friction rub. No gallop.  Pulmonary:     Effort: Pulmonary effort is normal. No tachypnea or respiratory distress.     Breath sounds: Normal breath sounds. No decreased breath sounds, wheezing, rhonchi or rales.  Chest:     Chest wall: No tenderness.  Abdominal:     General: Bowel sounds are normal.     Palpations: Abdomen is soft.  Musculoskeletal:        General: Normal range of motion.     Cervical back: Normal range of motion.  Skin:    General: Skin is warm and dry.  Neurological:      Mental Status: He is alert and oriented to person, place, and time.     Coordination: Coordination normal.  Psychiatric:        Behavior: Behavior normal. Behavior is cooperative.        Thought Content: Thought content normal.        Judgment: Judgment normal.         Patient has been counseled extensively about nutrition and exercise as well as the importance of adherence with medications and regular follow-up. The patient was given clear instructions to go to ER or return to medical center if symptoms don't improve, worsen or new problems develop. The patient verbalized understanding.   Follow-up: Return in about  3 months (around 01/31/2021).   Claiborne Rigg, FNP-BC [facility] [place], [place] [phone]   11/01/2020, 5:05 PM

## 2020-11-02 ENCOUNTER — Other Ambulatory Visit: Payer: Self-pay

## 2020-11-02 LAB — CMP14+EGFR
ALT: 41 IU/L (ref 0–44)
AST: 28 IU/L (ref 0–40)
Albumin/Globulin Ratio: 1.7 (ref 1.2–2.2)
Albumin: 5 g/dL — ABNORMAL HIGH (ref 3.8–4.8)
Alkaline Phosphatase: 110 IU/L (ref 44–121)
BUN/Creatinine Ratio: 9 — ABNORMAL LOW (ref 10–24)
BUN: 14 mg/dL (ref 8–27)
Bilirubin Total: 0.5 mg/dL (ref 0.0–1.2)
CO2: 24 mmol/L (ref 20–29)
Calcium: 9.7 mg/dL (ref 8.6–10.2)
Chloride: 100 mmol/L (ref 96–106)
Creatinine, Ser: 1.53 mg/dL — ABNORMAL HIGH (ref 0.76–1.27)
Globulin, Total: 2.9 g/dL (ref 1.5–4.5)
Glucose: 113 mg/dL — ABNORMAL HIGH (ref 70–99)
Potassium: 4.2 mmol/L (ref 3.5–5.2)
Sodium: 139 mmol/L (ref 134–144)
Total Protein: 7.9 g/dL (ref 6.0–8.5)
eGFR: 51 mL/min/{1.73_m2} — ABNORMAL LOW (ref >59)

## 2020-11-03 ENCOUNTER — Telehealth: Payer: Self-pay

## 2020-11-03 NOTE — Telephone Encounter (Signed)
-----   Message from Claiborne Rigg, NP sent at 11/02/2020 10:43 PM EDT ----- Creatinine slightly increased for kidney function. Make sure you are drinking plenty of water and avoid foods high in salt. Will need to repeat labs in 4 weeks.

## 2020-11-08 ENCOUNTER — Other Ambulatory Visit: Payer: Self-pay

## 2020-11-15 ENCOUNTER — Other Ambulatory Visit: Payer: Self-pay

## 2020-11-17 ENCOUNTER — Other Ambulatory Visit: Payer: Self-pay

## 2020-11-30 ENCOUNTER — Ambulatory Visit: Payer: BC Managed Care – PPO | Attending: Family Medicine

## 2020-11-30 ENCOUNTER — Other Ambulatory Visit: Payer: Self-pay

## 2020-11-30 DIAGNOSIS — I10 Essential (primary) hypertension: Secondary | ICD-10-CM

## 2020-12-01 LAB — CMP14+EGFR
ALT: 28 IU/L (ref 0–44)
AST: 25 IU/L (ref 0–40)
Albumin/Globulin Ratio: 1.7 (ref 1.2–2.2)
Albumin: 4.8 g/dL (ref 3.8–4.8)
Alkaline Phosphatase: 107 IU/L (ref 44–121)
BUN/Creatinine Ratio: 12 (ref 10–24)
BUN: 18 mg/dL (ref 8–27)
Bilirubin Total: 0.5 mg/dL (ref 0.0–1.2)
CO2: 25 mmol/L (ref 20–29)
Calcium: 9.7 mg/dL (ref 8.6–10.2)
Chloride: 100 mmol/L (ref 96–106)
Creatinine, Ser: 1.54 mg/dL — ABNORMAL HIGH (ref 0.76–1.27)
Globulin, Total: 2.8 g/dL (ref 1.5–4.5)
Glucose: 102 mg/dL — ABNORMAL HIGH (ref 70–99)
Potassium: 4.1 mmol/L (ref 3.5–5.2)
Sodium: 139 mmol/L (ref 134–144)
Total Protein: 7.6 g/dL (ref 6.0–8.5)
eGFR: 50 mL/min/{1.73_m2} — ABNORMAL LOW (ref >59)

## 2020-12-04 ENCOUNTER — Telehealth: Payer: Self-pay

## 2020-12-04 NOTE — Telephone Encounter (Signed)
-----   Message from Grayce Sessions, NP sent at 12/03/2020 10:49 PM EDT ----- Labs are essentially normal. There are some minor variations in your blood work that do not require any additional work up at this time. Will continue to monitor. Make sure you are drinking at least 48 oz of water per day. Work on eating a low fat, heart healthy diet and participate in regular aerobic exercise program to control as well. Exercise at least  30 minutes per day-5 days per week. Avoid red meat. No fried foods. No junk foods, sodas, sugary foods or drinks, unhealthy snacking, alcohol or smoking.    Any questions follow up with PCP

## 2020-12-13 ENCOUNTER — Other Ambulatory Visit: Payer: Self-pay

## 2020-12-15 ENCOUNTER — Other Ambulatory Visit: Payer: Self-pay

## 2021-02-12 ENCOUNTER — Ambulatory Visit: Payer: BC Managed Care – PPO | Admitting: Nurse Practitioner

## 2021-05-18 ENCOUNTER — Ambulatory Visit: Payer: BC Managed Care – PPO | Attending: Nurse Practitioner | Admitting: Nurse Practitioner

## 2021-05-18 ENCOUNTER — Encounter: Payer: Self-pay | Admitting: Nurse Practitioner

## 2021-05-18 ENCOUNTER — Other Ambulatory Visit: Payer: Self-pay

## 2021-05-18 VITALS — BP 130/82 | HR 100 | Wt 273.0 lb

## 2021-05-18 DIAGNOSIS — D582 Other hemoglobinopathies: Secondary | ICD-10-CM

## 2021-05-18 DIAGNOSIS — K219 Gastro-esophageal reflux disease without esophagitis: Secondary | ICD-10-CM

## 2021-05-18 DIAGNOSIS — E1165 Type 2 diabetes mellitus with hyperglycemia: Secondary | ICD-10-CM

## 2021-05-18 DIAGNOSIS — D649 Anemia, unspecified: Secondary | ICD-10-CM | POA: Diagnosis not present

## 2021-05-18 DIAGNOSIS — L918 Other hypertrophic disorders of the skin: Secondary | ICD-10-CM

## 2021-05-18 DIAGNOSIS — E782 Mixed hyperlipidemia: Secondary | ICD-10-CM | POA: Diagnosis not present

## 2021-05-18 DIAGNOSIS — I10 Essential (primary) hypertension: Secondary | ICD-10-CM | POA: Diagnosis not present

## 2021-05-18 DIAGNOSIS — N529 Male erectile dysfunction, unspecified: Secondary | ICD-10-CM | POA: Diagnosis not present

## 2021-05-18 LAB — POCT GLYCOSYLATED HEMOGLOBIN (HGB A1C): HbA1c, POC (prediabetic range): 7.1 % — AB (ref 5.7–6.4)

## 2021-05-18 MED ORDER — TADALAFIL 5 MG PO TABS
ORAL_TABLET | ORAL | 6 refills | Status: DC
  Filled 2021-05-18: qty 20, 30d supply, fill #0

## 2021-05-18 MED ORDER — CHLORTHALIDONE 25 MG PO TABS
25.0000 mg | ORAL_TABLET | Freq: Every day | ORAL | 1 refills | Status: DC
  Filled 2021-05-18: qty 90, 90d supply, fill #0

## 2021-05-18 MED ORDER — OMEPRAZOLE 20 MG PO CPDR
DELAYED_RELEASE_CAPSULE | ORAL | 1 refills | Status: DC
  Filled 2021-05-18: qty 90, 90d supply, fill #0

## 2021-05-18 MED ORDER — AMLODIPINE BESYLATE 10 MG PO TABS
ORAL_TABLET | Freq: Every day | ORAL | 1 refills | Status: DC
Start: 2021-05-18 — End: 2022-12-25
  Filled 2021-05-18: qty 90, 90d supply, fill #0

## 2021-05-18 MED ORDER — ATORVASTATIN CALCIUM 80 MG PO TABS
ORAL_TABLET | Freq: Every day | ORAL | 2 refills | Status: DC
  Filled 2021-05-18: qty 90, 90d supply, fill #0

## 2021-05-18 NOTE — Progress Notes (Signed)
? ?Assessment & Plan:  ?[name] was seen today for medication refill and hypertension. ? ?Diagnoses and all orders for this visit: ? ?Essential hypertension ?-     amLODipine (NORVASC) 10 MG tablet; TAKE 1 TABLET (10 MG TOTAL) BY MOUTH DAILY. ?-     chlorthalidone (HYGROTON) 25 MG tablet; Take 1 tablet (25 mg total) by mouth daily. FOR BLOOD PRESSURE. Please fill as a 90 day supply ?-     CMP14+EGFR ? ?Mixed hyperlipidemia ?-     atorvastatin (LIPITOR) 80 MG tablet; TAKE 1 TABLET (80 MG TOTAL) BY MOUTH DAILY. ? ?GERD without esophagitis ?-     omeprazole (PRILOSEC) 20 MG capsule; TAKE 1 CAPSULE (20 MG TOTAL) BY MOUTH DAILY FOR HEARTBURN ?INSTRUCTIONS: Avoid GERD Triggers: acidic, spicy or fried foods, caffeine, coffee, sodas,  alcohol and chocolate.   ? ?Erectile dysfunction, unspecified erectile dysfunction type ?-     tadalafil (CIALIS) 5 MG tablet; TAKE 2-4 TABLETS (10-20 MG TOTAL) BY MOUTH DAILY AS NEEDED FOR ERECTILE DYSFUNCTION. ? ?Skin tag ?-     Ambulatory referral to Dermatology ? ?Anemia, unspecified type ?-     CBC ? 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 ? ? ?Patient has been counseled on age-appropriate routine health concerns for screening and prevention. These are reviewed and up-to-date. Referrals have been placed accordingly. Immunizations are up-to-date or declined.    ?Subjective:  ? ?Chief Complaint  ?Patient presents with  ? Medication Refill  ? Hypertension  ? ?Alexander Hunt 66 y.o. male presents to office today for follow up to HTN ?He has a past medical history of Hyperlipidemia, Hypertension (03/13/2016), and Prediabetes.  ? ? ?HTN ?Blood pressure is well controlled. He is requesting refills of chlorthalidone 25 mg daily and amlodipine 10 mg daily.  Weight is up about 12 pounds since his last office visit.  Likely due to dietary nonadherence. ?BP Readings from Last 3 Encounters:  ?05/18/21 130/82  ?11/01/20 117/76  ?08/01/20 (!) 146/77  ? ?Diabetes Mellitus ?Not well controlled. Will need  to start glucose lowering agent if not weight loss and A1c >7 at next office visit.  ?Lab Results  ?Component Value Date  ? HGBA1C 7.1 (A) 05/18/2021  ?  ?Lab Results  ?Component Value Date  ? HGBA1C 6.7 (H) 08/01/2020  ?   ? ?Review of Systems  ?Constitutional:  Negative for fever, malaise/fatigue and weight loss.  ?HENT: Negative.  Negative for nosebleeds.   ?Eyes: Negative.  Negative for blurred vision, double vision and photophobia.  ?Respiratory: Negative.  Negative for cough and shortness of breath.   ?Cardiovascular: Negative.  Negative for chest pain, palpitations and leg swelling.  ?Gastrointestinal:  Positive for heartburn. Negative for nausea and vomiting.  ?Genitourinary:   ?     ED  ?Musculoskeletal: Negative.  Negative for myalgias.  ?Neurological: Negative.  Negative for dizziness, focal weakness, seizures and headaches.  ?Psychiatric/Behavioral: Negative.  Negative for suicidal ideas.   ? ?Past Medical History:  ?Diagnosis Date  ? Hyperlipidemia   ? Hypertension 03/13/2016  ? Prediabetes   ? ? ?Past Surgical History:  ?Procedure Laterality Date  ? NO PAST SURGERIES    ? ? ?Family History  ?Problem Relation Age of Onset  ? Diabetes Maternal Grandmother   ? Hypertension Maternal Grandmother   ? Colon cancer Neg Hx   ? Colon polyps Neg Hx   ? Rectal cancer Neg Hx   ? Stomach cancer Neg Hx   ? Esophageal cancer Neg Hx   ? ? ?  Social History Reviewed with no changes to be made today.  ? ?Outpatient Medications Prior to Visit  ?Medication Sig Dispense Refill  ? amLODipine (NORVASC) 10 MG tablet TAKE 1 TABLET (10 MG TOTAL) BY MOUTH DAILY. 90 tablet 1  ? atorvastatin (LIPITOR) 80 MG tablet TAKE 1 TABLET (80 MG TOTAL) BY MOUTH DAILY. 90 tablet 2  ? chlorthalidone (HYGROTON) 25 MG tablet Take 1 tablet (25 mg total) by mouth daily. FOR BLOOD PRESSURE. Please fill as a 90 day supply 90 tablet 1  ? omeprazole (PRILOSEC) 20 MG capsule TAKE 1 CAPSULE (20 MG TOTAL) BY MOUTH DAILY FOR HEARTBURN 90 capsule 1  ?  tadalafil (CIALIS) 5 MG tablet TAKE 2-4 TABLETS (10-20 MG TOTAL) BY MOUTH DAILY AS NEEDED FOR ERECTILE DYSFUNCTION. 20 tablet 6  ? ?Facility-Administered Medications Prior to Visit  ?Medication Dose Route Frequency Provider Last Rate Last Admin  ? 0.9 %  sodium chloride infusion  500 mL Intravenous Continuous Gatha Mayer, MD      ? ? ?No Known Allergies ? ?   ?Objective:  ?  ?BP 130/82   Pulse 100   Wt 273 lb (123.8 kg)   SpO2 97%   BMI 33.23 kg/m?  ?Wt Readings from Last 3 Encounters:  ?05/18/21 273 lb (123.8 kg)  ?11/01/20 261 lb 8 oz (118.6 kg)  ?08/01/20 274 lb (124.3 kg)  ? ? ?Physical Exam ?Vitals and nursing note reviewed.  ?Constitutional:   ?   Appearance: He is well-developed.  ?HENT:  ?   Head: Normocephalic and atraumatic.  ?Eyes:  ? ?   Comments: Skin tag  ?Cardiovascular:  ?   Rate and Rhythm: Normal rate and regular rhythm.  ?   Heart sounds: Normal heart sounds. No murmur heard. ?  No friction rub. No gallop.  ?Pulmonary:  ?   Effort: Pulmonary effort is normal. No tachypnea or respiratory distress.  ?   Breath sounds: Normal breath sounds. No decreased breath sounds, wheezing, rhonchi or rales.  ?Chest:  ?   Chest wall: No tenderness.  ?Abdominal:  ?   General: Bowel sounds are normal.  ?   Palpations: Abdomen is soft.  ?Musculoskeletal:     ?   General: Normal range of motion.  ?   Cervical back: Normal range of motion.  ?Skin: ?   General: Skin is warm and dry.  ?Neurological:  ?   Mental Status: He is alert and oriented to person, place, and time.  ?   Coordination: Coordination normal.  ?Psychiatric:     ?   Behavior: Behavior normal. Behavior is cooperative.     ?   Thought Content: Thought content normal.     ?   Judgment: Judgment normal.  ? ? ? ? ?   ?Patient has been counseled extensively about nutrition and exercise as well as the importance of adherence with medications and regular follow-up. The patient was given clear instructions to go to ER or return to medical center if  symptoms don't improve, worsen or new problems develop. The patient verbalized understanding.  ? ?Follow-up: Return in about 3 months (around 08/17/2021).  ? ?Gildardo Pounds, FNP-BC ?[place] ?[place], [place] ?[phone]   ?05/18/2021, 3:17 PM ?

## 2021-05-19 LAB — CMP14+EGFR
ALT: 31 IU/L (ref 0–44)
AST: 25 IU/L (ref 0–40)
Albumin/Globulin Ratio: 2 (ref 1.2–2.2)
Albumin: 4.9 g/dL — ABNORMAL HIGH (ref 3.8–4.8)
Alkaline Phosphatase: 116 IU/L (ref 44–121)
BUN/Creatinine Ratio: 11 (ref 10–24)
BUN: 16 mg/dL (ref 8–27)
Bilirubin Total: 0.5 mg/dL (ref 0.0–1.2)
CO2: 26 mmol/L (ref 20–29)
Calcium: 9.8 mg/dL (ref 8.6–10.2)
Chloride: 103 mmol/L (ref 96–106)
Creatinine, Ser: 1.51 mg/dL — ABNORMAL HIGH (ref 0.76–1.27)
Globulin, Total: 2.5 g/dL (ref 1.5–4.5)
Glucose: 130 mg/dL — ABNORMAL HIGH (ref 70–99)
Potassium: 4.8 mmol/L (ref 3.5–5.2)
Sodium: 140 mmol/L (ref 134–144)
Total Protein: 7.4 g/dL (ref 6.0–8.5)
eGFR: 51 mL/min/{1.73_m2} — ABNORMAL LOW (ref >59)

## 2021-05-19 LAB — CBC
Hematocrit: 38.2 % (ref 37.5–51.0)
Hemoglobin: 12.3 g/dL — ABNORMAL LOW (ref 13.0–17.7)
MCH: 20.8 pg — ABNORMAL LOW (ref 26.6–33.0)
MCHC: 32.2 g/dL (ref 31.5–35.7)
MCV: 65 fL — ABNORMAL LOW (ref 79–97)
Platelets: 170 10*3/uL (ref 150–450)
RBC: 5.92 x10E6/uL — ABNORMAL HIGH (ref 4.14–5.80)
RDW: 17.3 % — ABNORMAL HIGH (ref 11.6–15.4)
WBC: 6.8 10*3/uL (ref 3.4–10.8)

## 2021-05-21 ENCOUNTER — Other Ambulatory Visit: Payer: Self-pay

## 2021-05-23 ENCOUNTER — Other Ambulatory Visit: Payer: Self-pay

## 2021-05-29 ENCOUNTER — Other Ambulatory Visit: Payer: Self-pay

## 2021-08-17 ENCOUNTER — Ambulatory Visit: Payer: BC Managed Care – PPO | Admitting: Nurse Practitioner
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
## 2022-04-09 ENCOUNTER — Ambulatory Visit: Payer: BC Managed Care – PPO | Admitting: Nurse Practitioner

## 2022-12-25 ENCOUNTER — Encounter: Payer: Self-pay | Admitting: Nurse Practitioner

## 2022-12-25 ENCOUNTER — Ambulatory Visit: Payer: Medicare Other | Attending: Nurse Practitioner | Admitting: Nurse Practitioner

## 2022-12-25 ENCOUNTER — Other Ambulatory Visit: Payer: Self-pay

## 2022-12-25 VITALS — BP 148/88 | HR 76 | Ht 76.0 in | Wt 269.5 lb

## 2022-12-25 DIAGNOSIS — I10 Essential (primary) hypertension: Secondary | ICD-10-CM | POA: Diagnosis not present

## 2022-12-25 DIAGNOSIS — K219 Gastro-esophageal reflux disease without esophagitis: Secondary | ICD-10-CM

## 2022-12-25 DIAGNOSIS — R221 Localized swelling, mass and lump, neck: Secondary | ICD-10-CM

## 2022-12-25 DIAGNOSIS — Z0000 Encounter for general adult medical examination without abnormal findings: Secondary | ICD-10-CM

## 2022-12-25 DIAGNOSIS — N529 Male erectile dysfunction, unspecified: Secondary | ICD-10-CM

## 2022-12-25 DIAGNOSIS — Z0001 Encounter for general adult medical examination with abnormal findings: Secondary | ICD-10-CM | POA: Diagnosis not present

## 2022-12-25 DIAGNOSIS — D582 Other hemoglobinopathies: Secondary | ICD-10-CM

## 2022-12-25 DIAGNOSIS — D649 Anemia, unspecified: Secondary | ICD-10-CM

## 2022-12-25 DIAGNOSIS — E785 Hyperlipidemia, unspecified: Secondary | ICD-10-CM | POA: Diagnosis not present

## 2022-12-25 DIAGNOSIS — E1165 Type 2 diabetes mellitus with hyperglycemia: Secondary | ICD-10-CM | POA: Diagnosis not present

## 2022-12-25 DIAGNOSIS — H21569 Pupillary abnormality, unspecified eye: Secondary | ICD-10-CM

## 2022-12-25 DIAGNOSIS — E782 Mixed hyperlipidemia: Secondary | ICD-10-CM

## 2022-12-25 MED ORDER — AMLODIPINE BESYLATE 10 MG PO TABS
10.0000 mg | ORAL_TABLET | Freq: Every day | ORAL | 1 refills | Status: DC
  Filled 2022-12-25: qty 90, 90d supply, fill #0
  Filled 2023-05-08: qty 90, 90d supply, fill #1

## 2022-12-25 MED ORDER — TADALAFIL 5 MG PO TABS
ORAL_TABLET | ORAL | 6 refills | Status: DC
  Filled 2022-12-25: qty 20, 5d supply, fill #0
  Filled 2023-05-08: qty 20, 5d supply, fill #1

## 2022-12-25 MED ORDER — CHLORTHALIDONE 25 MG PO TABS
25.0000 mg | ORAL_TABLET | Freq: Every day | ORAL | 1 refills | Status: DC
  Filled 2022-12-25: qty 90, 90d supply, fill #0
  Filled 2023-05-08: qty 90, 90d supply, fill #1

## 2022-12-25 MED ORDER — OMEPRAZOLE 20 MG PO CPDR
20.0000 mg | DELAYED_RELEASE_CAPSULE | Freq: Every day | ORAL | 1 refills | Status: DC
  Filled 2022-12-25: qty 90, 90d supply, fill #0
  Filled 2023-05-08: qty 90, 90d supply, fill #1

## 2022-12-25 MED ORDER — ATORVASTATIN CALCIUM 80 MG PO TABS
80.0000 mg | ORAL_TABLET | Freq: Every day | ORAL | 2 refills | Status: DC
  Filled 2022-12-25: qty 90, 90d supply, fill #0
  Filled 2023-05-08: qty 90, 90d supply, fill #1
  Filled 2023-07-30: qty 90, 90d supply, fill #2

## 2022-12-25 NOTE — Progress Notes (Signed)
Assessment & Plan:  Alexander Hunt was seen today for annual exam.  Diagnoses and all orders for this visit:  Encounter for annual physical exam -     CMP14+EGFR -     CBC with Differential -     Hemoglobin A1c -     Lipid panel  Essential hypertension -     amLODipine (NORVASC) 10 MG tablet; Take 1 tablet (10 mg total) by mouth daily. -     chlorthalidone (HYGROTON) 25 MG tablet; Take 1 tablet (25 mg total) by mouth daily. FOR BLOOD PRESSURE. Continue all antihypertensives as prescribed.  Reminded to bring in blood pressure log for follow  up appointment.  RECOMMENDATIONS: DASH/Mediterranean Diets are healthier choices for HTN.    GERD without esophagitis -     omeprazole (PRILOSEC) 20 MG capsule; Take 1 capsule (20 mg total) by mouth daily for heartburn INSTRUCTIONS: Avoid GERD Triggers: acidic, spicy or fried foods, caffeine, coffee, sodas,  alcohol and chocolate.    Erectile dysfunction, unspecified erectile dysfunction type -     tadalafil (CIALIS) 5 MG tablet; TAKE 2-4 TABLETS (10-20 MG TOTAL) BY MOUTH DAILY AS NEEDED FOR ERECTILE DYSFUNCTION.  Type 2 diabetes mellitus with hyperglycemia, without long-term current use of insulin (HCC) -     Hemoglobin A1c -     Microalbumin / creatinine urine ratio  Dyslipidemia, goal LDL below 70 -     atorvastatin (LIPITOR) 80 MG tablet; Take 1 tablet (80 mg total) by mouth daily. -     Lipid panel INSTRUCTIONS: Work on a low fat, heart healthy diet and participate in regular aerobic exercise program by working out at least 150 minutes per week; 5 days a week-30 minutes per day. Avoid red meat/beef/steak,  fried foods. junk foods, sodas, sugary drinks, unhealthy snacking, alcohol and smoking.  Drink at least 80 oz of water per day and monitor your carbohydrate intake daily.    Low hemoglobin -     CBC with Differential  Neck fullness -     US THYROID; Future  Hemoglobinopathy (HCC) -     CBC with Differential  Abnormal size of  pupil -     Ambulatory referral to Ophthalmology    Patient has been counseled on age-appropriate routine health concerns for screening and prevention. These are reviewed and up-to-date. Referrals have been placed accordingly. Immunizations are up-to-date or declined.    Subjective:   Chief Complaint  Patient presents with   Annual Exam    Alexander Hunt 67 y.o. male presents to office today for annual physical.    BP Readings from Last 3 Encounters:  12/25/22 (!) 148/88  05/18/21 130/82  11/01/20 117/76     Review of Systems  Constitutional:  Negative for fever, malaise/fatigue and weight loss.  HENT: Negative.  Negative for nosebleeds.   Eyes: Negative.  Negative for blurred vision, double vision and photophobia.  Respiratory: Negative.  Negative for cough and shortness of breath.   Cardiovascular: Negative.  Negative for chest pain, palpitations and leg swelling.  Gastrointestinal: Negative.  Negative for heartburn, nausea and vomiting.  Genitourinary: Negative.   Musculoskeletal: Negative.  Negative for myalgias.  Skin: Negative.   Neurological: Negative.  Negative for dizziness, focal weakness, seizures and headaches.  Endo/Heme/Allergies: Negative.   Psychiatric/Behavioral: Negative.  Negative for suicidal ideas.     Past Medical History:  Diagnosis Date   Hyperlipidemia    Hypertension 03/13/2016   Prediabetes     Past Surgical History:  Procedure Laterality  Date   NO PAST SURGERIES      Family History  Problem Relation Age of Onset   Diabetes Maternal Grandmother    Hypertension Maternal Grandmother    Colon cancer Neg Hx    Colon polyps Neg Hx    Rectal cancer Neg Hx    Stomach cancer Neg Hx    Esophageal cancer Neg Hx     Social History Reviewed with no changes to be made today.   Outpatient Medications Prior to Visit  Medication Sig Dispense Refill   amLODipine (NORVASC) 10 MG tablet TAKE 1 TABLET (10 MG TOTAL) BY MOUTH DAILY. 90 tablet 1    atorvastatin (LIPITOR) 80 MG tablet TAKE 1 TABLET (80 MG TOTAL) BY MOUTH DAILY. 90 tablet 2   chlorthalidone (HYGROTON) 25 MG tablet Take 1 tablet (25 mg total) by mouth daily. FOR BLOOD PRESSURE. Please fill as a 90 day supply 90 tablet 1   omeprazole (PRILOSEC) 20 MG capsule TAKE 1 CAPSULE (20 MG TOTAL) BY MOUTH DAILY FOR HEARTBURN 90 capsule 1   tadalafil (CIALIS) 5 MG tablet TAKE 2-4 TABLETS (10-20 MG TOTAL) BY MOUTH DAILY AS NEEDED FOR ERECTILE DYSFUNCTION. 20 tablet 6   Facility-Administered Medications Prior to Visit  Medication Dose Route Frequency Provider Last Rate Last Admin   0.9 %  sodium chloride infusion  500 mL Intravenous Continuous Iva Boop, MD        No Known Allergies     Objective:    BP (!) 148/88   Pulse 76   Ht 6\' 4"  (1.93 m)   Wt 269 lb 8 oz (122.2 kg)   SpO2 97%   BMI 32.80 kg/m  Wt Readings from Last 3 Encounters:  12/25/22 269 lb 8 oz (122.2 kg)  05/18/21 273 lb (123.8 kg)  11/01/20 261 lb 8 oz (118.6 kg)    Physical Exam Constitutional:      Appearance: He is well-developed.  HENT:     Head: Normocephalic and atraumatic.     Right Ear: Hearing, tympanic membrane, ear canal and external ear normal.     Left Ear: Hearing, tympanic membrane, ear canal and external ear normal.     Nose: Nose normal. No mucosal edema or rhinorrhea.     Right Turbinates: Not enlarged.     Left Turbinates: Not enlarged.     Mouth/Throat:     Lips: Pink.     Mouth: Mucous membranes are moist.     Dentition: No gingival swelling, dental abscesses or gum lesions.     Pharynx: Uvula midline.     Tonsils: No tonsillar exudate. 1+ on the right. 1+ on the left.  Eyes:     General: Lids are normal. No scleral icterus.    Extraocular Movements: Extraocular movements intact.     Conjunctiva/sclera: Conjunctivae normal.     Pupils: Pupils are unequal.  Neck:     Thyroid: Thyromegaly present.     Trachea: No tracheal deviation.  Cardiovascular:     Rate and  Rhythm: Normal rate and regular rhythm.     Heart sounds: Normal heart sounds. No murmur heard.    No friction rub. No gallop.  Pulmonary:     Effort: Pulmonary effort is normal. No respiratory distress.     Breath sounds: Normal breath sounds. No wheezing or rales.  Chest:     Chest wall: No mass or tenderness.  Breasts:    Right: No inverted nipple, mass, nipple discharge, skin change or tenderness.  Left: No inverted nipple, mass, nipple discharge, skin change or tenderness.  Abdominal:     General: Bowel sounds are normal. There is no distension.     Palpations: Abdomen is soft. There is no mass.     Tenderness: There is no abdominal tenderness. There is no guarding or rebound.  Musculoskeletal:        General: No tenderness or deformity. Normal range of motion.     Cervical back: Normal range of motion and neck supple.  Lymphadenopathy:     Cervical: No cervical adenopathy.  Skin:    General: Skin is warm and dry.     Capillary Refill: Capillary refill takes less than 2 seconds.     Findings: No erythema.  Neurological:     Mental Status: He is alert and oriented to person, place, and time.     Cranial Nerves: No cranial nerve deficit.     Sensory: Sensation is intact.     Motor: No abnormal muscle tone.     Coordination: Coordination is intact. Coordination normal.     Gait: Gait is intact.     Deep Tendon Reflexes: Reflexes normal.     Reflex Scores:      Patellar reflexes are 1+ on the right side and 1+ on the left side. Psychiatric:        Attention and Perception: Attention normal.        Mood and Affect: Mood normal.        Speech: Speech normal.        Behavior: Behavior normal.        Thought Content: Thought content normal.        Judgment: Judgment normal.          Patient has been counseled extensively about nutrition and exercise as well as the importance of adherence with medications and regular follow-up. The patient was given clear instructions to  go to ER or return to medical center if symptoms don't improve, worsen or new problems develop. The patient verbalized understanding.   Follow-up: Return in about 3 months (around 03/27/2023) for HTN.   Claiborne Rigg, FNP-BC [facility] [place], [place] [phone]   01/19/2023, 11:34 PM

## 2022-12-27 LAB — CBC WITH DIFFERENTIAL/PLATELET
Basophils Absolute: 0.1 10*3/uL (ref 0.0–0.2)
Basos: 1 %
EOS (ABSOLUTE): 0.1 10*3/uL (ref 0.0–0.4)
Eos: 2 %
Hematocrit: 42.2 % (ref 37.5–51.0)
Hemoglobin: 13.6 g/dL (ref 13.0–17.7)
Immature Grans (Abs): 0 10*3/uL (ref 0.0–0.1)
Immature Granulocytes: 0 %
Lymphocytes Absolute: 2.2 10*3/uL (ref 0.7–3.1)
Lymphs: 30 %
MCH: 20.9 pg — ABNORMAL LOW (ref 26.6–33.0)
MCHC: 32.2 g/dL (ref 31.5–35.7)
MCV: 65 fL — ABNORMAL LOW (ref 79–97)
Monocytes Absolute: 0.5 10*3/uL (ref 0.1–0.9)
Monocytes: 6 %
Neutrophils Absolute: 4.7 10*3/uL (ref 1.4–7.0)
Neutrophils: 61 %
Platelets: 242 10*3/uL (ref 150–450)
RBC: 6.5 x10E6/uL — ABNORMAL HIGH (ref 4.14–5.80)
RDW: 18.2 % — ABNORMAL HIGH (ref 11.6–15.4)
WBC: 7.5 10*3/uL (ref 3.4–10.8)

## 2022-12-27 LAB — HEMOGLOBIN A1C
Est. average glucose Bld gHb Est-mCnc: 151 mg/dL
Hgb A1c MFr Bld: 6.9 % — ABNORMAL HIGH (ref 4.8–5.6)

## 2022-12-27 LAB — MICROALBUMIN / CREATININE URINE RATIO
Creatinine, Urine: 113.5 mg/dL
Microalb/Creat Ratio: 4 mg/g{creat} (ref 0–29)
Microalbumin, Urine: 5.1 ug/mL

## 2022-12-27 LAB — CMP14+EGFR
ALT: 27 [IU]/L (ref 0–44)
AST: 24 [IU]/L (ref 0–40)
Albumin: 4.8 g/dL (ref 3.9–4.9)
Alkaline Phosphatase: 129 [IU]/L — ABNORMAL HIGH (ref 44–121)
BUN/Creatinine Ratio: 9 — ABNORMAL LOW (ref 10–24)
BUN: 12 mg/dL (ref 8–27)
Bilirubin Total: 0.4 mg/dL (ref 0.0–1.2)
CO2: 20 mmol/L (ref 20–29)
Calcium: 9.6 mg/dL (ref 8.6–10.2)
Chloride: 103 mmol/L (ref 96–106)
Creatinine, Ser: 1.34 mg/dL — ABNORMAL HIGH (ref 0.76–1.27)
Globulin, Total: 3.3 g/dL (ref 1.5–4.5)
Glucose: 97 mg/dL (ref 70–99)
Potassium: 4.7 mmol/L (ref 3.5–5.2)
Sodium: 137 mmol/L (ref 134–144)
Total Protein: 8.1 g/dL (ref 6.0–8.5)
eGFR: 58 mL/min/{1.73_m2} — ABNORMAL LOW (ref >59)

## 2022-12-27 LAB — LIPID PANEL
Chol/HDL Ratio: 4.5 ratio (ref 0.0–5.0)
Cholesterol, Total: 153 mg/dL (ref 100–199)
HDL: 34 mg/dL — ABNORMAL LOW (ref >39)
LDL Chol Calc (NIH): 83 mg/dL (ref 0–99)
Triglycerides: 211 mg/dL — ABNORMAL HIGH (ref 0–149)
VLDL Cholesterol Cal: 36 mg/dL (ref 5–40)

## 2022-12-29 ENCOUNTER — Other Ambulatory Visit: Payer: Self-pay | Admitting: Nurse Practitioner

## 2022-12-29 DIAGNOSIS — D508 Other iron deficiency anemias: Secondary | ICD-10-CM

## 2022-12-29 MED ORDER — FERROUS GLUCONATE 324 (38 FE) MG PO TABS
324.0000 mg | ORAL_TABLET | Freq: Every day | ORAL | 1 refills | Status: AC
  Filled 2022-12-29: qty 90, 90d supply, fill #0
  Filled 2023-05-08: qty 90, 90d supply, fill #1

## 2022-12-30 ENCOUNTER — Other Ambulatory Visit: Payer: Self-pay

## 2022-12-30 ENCOUNTER — Other Ambulatory Visit: Payer: Medicare Other

## 2023-01-01 ENCOUNTER — Other Ambulatory Visit: Payer: Medicare Other

## 2023-01-07 ENCOUNTER — Other Ambulatory Visit: Payer: Medicare Other

## 2023-01-14 ENCOUNTER — Ambulatory Visit
Admission: RE | Admit: 2023-01-14 | Discharge: 2023-01-14 | Disposition: A | Discharge status: Home / Self Care | Payer: Medicare Other | Source: Ambulatory Visit | Attending: Nurse Practitioner | Admitting: Nurse Practitioner

## 2023-01-14 DIAGNOSIS — R221 Localized swelling, mass and lump, neck: Secondary | ICD-10-CM

## 2023-01-17 ENCOUNTER — Other Ambulatory Visit: Payer: Self-pay

## 2023-01-17 ENCOUNTER — Telehealth: Payer: Self-pay

## 2023-01-17 DIAGNOSIS — E1165 Type 2 diabetes mellitus with hyperglycemia: Secondary | ICD-10-CM

## 2023-01-17 NOTE — Telephone Encounter (Signed)
Copied from CRM [id number]. Topic: Referral - Status >> Jan 16, 2023 12:04 PM Franchot Heidelberg wrote: Reason for CRM: Pt called checking the status of his ophthalmology referral please advise

## 2023-01-17 NOTE — Telephone Encounter (Signed)
Referral sent. Ophthalmology referral sent , they will contact patient to schedule.

## 2023-01-19 ENCOUNTER — Encounter: Payer: Self-pay | Admitting: Nurse Practitioner

## 2023-02-28 ENCOUNTER — Other Ambulatory Visit: Payer: Self-pay

## 2023-02-28 ENCOUNTER — Other Ambulatory Visit (HOSPITAL_COMMUNITY): Payer: Self-pay

## 2023-02-28 MED ORDER — KETOROLAC TROMETHAMINE 0.5 % OP SOLN
1.0000 [drp] | Freq: Four times a day (QID) | OPHTHALMIC | 1 refills | Status: AC
  Filled 2023-02-28 (×3): qty 5, 25d supply, fill #0
  Filled 2023-05-08: qty 5, 25d supply, fill #1

## 2023-02-28 MED ORDER — GATIFLOXACIN 0.5 % OP SOLN
1.0000 [drp] | Freq: Four times a day (QID) | OPHTHALMIC | 1 refills | Status: AC
  Filled 2023-02-28 (×2): qty 5, 25d supply, fill #0
  Filled 2023-05-08: qty 5, 36d supply, fill #1

## 2023-02-28 MED ORDER — PREDNISOLONE ACETATE 1 % OP SUSP
1.0000 [drp] | Freq: Four times a day (QID) | OPHTHALMIC | 1 refills | Status: AC
  Filled 2023-02-28 (×2): qty 5, 25d supply, fill #0
  Filled 2023-05-08: qty 5, 25d supply, fill #1

## 2023-03-07 ENCOUNTER — Other Ambulatory Visit (HOSPITAL_COMMUNITY): Payer: Self-pay

## 2023-03-28 ENCOUNTER — Ambulatory Visit: Payer: Medicare Other | Admitting: Nurse Practitioner

## 2023-04-07 ENCOUNTER — Ambulatory Visit: Payer: Medicare Other | Admitting: Nurse Practitioner

## 2023-05-08 ENCOUNTER — Other Ambulatory Visit: Payer: Self-pay

## 2023-05-19 ENCOUNTER — Encounter: Payer: Self-pay | Admitting: Nurse Practitioner

## 2023-05-19 ENCOUNTER — Ambulatory Visit: Attending: Nurse Practitioner | Admitting: Nurse Practitioner

## 2023-05-19 VITALS — BP 112/77 | HR 93 | Resp 20 | Ht 76.0 in | Wt 252.4 lb

## 2023-05-19 DIAGNOSIS — I10 Essential (primary) hypertension: Secondary | ICD-10-CM

## 2023-05-19 DIAGNOSIS — D508 Other iron deficiency anemias: Secondary | ICD-10-CM

## 2023-05-19 DIAGNOSIS — E1165 Type 2 diabetes mellitus with hyperglycemia: Secondary | ICD-10-CM | POA: Diagnosis not present

## 2023-05-19 MED ORDER — AMLODIPINE BESYLATE 10 MG PO TABS
10.0000 mg | ORAL_TABLET | Freq: Every day | ORAL | 1 refills | Status: AC
  Filled 2023-05-19 – 2023-07-30 (×2): qty 90, 90d supply, fill #0
  Filled 2024-01-14: qty 90, 90d supply, fill #1

## 2023-05-19 MED ORDER — CHLORTHALIDONE 25 MG PO TABS
25.0000 mg | ORAL_TABLET | Freq: Every day | ORAL | 1 refills | Status: AC
  Filled 2023-05-19 – 2023-07-30 (×2): qty 90, 90d supply, fill #0
  Filled 2024-02-09: qty 90, 90d supply, fill #1

## 2023-05-19 NOTE — Progress Notes (Signed)
 Assessment & Plan:  Jousha was seen today for medical management of chronic issues.  Diagnoses and all orders for this visit:  Essential hypertension -     chlorthalidone (HYGROTON) 25 MG tablet; Take 1 tablet (25 mg total) by mouth daily. FOR BLOOD PRESSURE. -     amLODipine (NORVASC) 10 MG tablet; Take 1 tablet (10 mg total) by mouth daily. Continue all antihypertensives as prescribed.  Reminded to bring in blood pressure log for follow  up appointment.  RECOMMENDATIONS: DASH/Mediterranean Diets are healthier choices for HTN.    Type 2 diabetes mellitus with hyperglycemia, without long-term current use of insulin (HCC) -     CMP14+EGFR Continue blood sugar control as discussed in office today, low carbohydrate diet, and regular physical exercise as tolerated, 150 minutes per week (30 min each day, 5 days per week, or 50 min 3 days per week). Keep blood sugar logs with fasting goal of 90-130 mg/dl, post prandial (after you eat) less than 180.  For Hypoglycemia: BS <60 and Hyperglycemia BS >400; contact the clinic ASAP. Annual eye exams and foot exams are recommended.   Other iron deficiency anemia -     CBC with Differential/Platelet    Patient has been counseled on age-appropriate routine health concerns for screening and prevention. These are reviewed and up-to-date. Referrals have been placed accordingly. Immunizations are up-to-date or declined.    Subjective:   Chief Complaint  Patient presents with   Medical Management of Chronic Issues    Myson Granholm 68 y.o. male presents to office today for follow up to HTN   He has a past medical history of Hyperlipidemia, Hypertension (03/13/2016), and Prediabetes.   HTN Blood pressure is well controlled. He is currently taking amlodipine 10 mg daily, chlorthalidone 25 mg daily.  BP Readings from Last 3 Encounters:  05/19/23 112/77  12/25/22 (!) 148/88  05/18/21 130/82     DM  A1c at goal. He was previously [place]  but has not taken this medication in quite some time.  Lab Results  Component Value Date   HGBA1C 6.9 (H) 12/25/2022      Review of Systems  Constitutional:  Negative for fever, malaise/fatigue and weight loss.  HENT: Negative.  Negative for nosebleeds.   Eyes: Negative.  Negative for blurred vision, double vision and photophobia.  Respiratory: Negative.  Negative for cough and shortness of breath.   Cardiovascular: Negative.  Negative for chest pain, palpitations and leg swelling.  Gastrointestinal: Negative.  Negative for heartburn, nausea and vomiting.  Musculoskeletal: Negative.  Negative for myalgias.  Neurological: Negative.  Negative for dizziness, focal weakness, seizures and headaches.  Psychiatric/Behavioral: Negative.  Negative for suicidal ideas.     Past Medical History:  Diagnosis Date   Hyperlipidemia    Hypertension 03/13/2016   Prediabetes     Past Surgical History:  Procedure Laterality Date   NO PAST SURGERIES      Family History  Problem Relation Age of Onset   Diabetes Maternal Grandmother    Hypertension Maternal Grandmother    Colon cancer Neg Hx    Colon polyps Neg Hx    Rectal cancer Neg Hx    Stomach cancer Neg Hx    Esophageal cancer Neg Hx     Social History Reviewed with no changes to be made today.   Outpatient Medications Prior to Visit  Medication Sig Dispense Refill   atorvastatin (LIPITOR) 80 MG tablet Take 1 tablet (80 mg total) by mouth daily. 90 tablet  2   ferrous gluconate (FERGON) 324 MG tablet Take 1 tablet (324 mg total) by mouth daily. 90 tablet 1   omeprazole (PRILOSEC) 20 MG capsule Take 1 capsule (20 mg total) by mouth daily for heartburn. 90 capsule 1   tadalafil (CIALIS) 5 MG tablet TAKE 2-4 TABLETS (10-20 MG TOTAL) BY MOUTH DAILY AS NEEDED FOR ERECTILE DYSFUNCTION. 20 tablet 6   amLODipine (NORVASC) 10 MG tablet Take 1 tablet (10 mg total) by mouth daily. 90 tablet 1   chlorthalidone (HYGROTON) 25 MG tablet Take 1  tablet (25 mg total) by mouth daily. FOR BLOOD PRESSURE. 90 tablet 1   gatifloxacin (ZYMAXID) 0.5 % SOLN Place 1 drop into the right eye 4 (four) times daily as directed. Store supside down (Patient not taking: Reported on 05/19/2023) 5 mL 1   ketorolac (ACULAR) 0.5 % ophthalmic solution Place 1 drop into the right eye 4 (four) times daily as directed. (Patient not taking: Reported on 05/19/2023) 5 mL 1   prednisoLONE acetate (PRED FORTE) 1 % ophthalmic suspension Place 1 drop into the right eye 4 (four) times daily for 5 days then stop. (Patient not taking: Reported on 05/19/2023) 5 mL 1   Facility-Administered Medications Prior to Visit  Medication Dose Route Frequency Provider Last Rate Last Admin   0.9 %  sodium chloride infusion  500 mL Intravenous Continuous Kenney Peacemaker, MD        No Known Allergies     Objective:    BP 112/77 (BP Location: Left Arm, Patient Position: Sitting, Cuff Size: Normal)   Pulse 93   Resp 20   Ht 6\' 4"  (1.93 m)   Wt 252 lb 6.4 oz (114.5 kg)   SpO2 98%   BMI 30.72 kg/m  Wt Readings from Last 3 Encounters:  05/19/23 252 lb 6.4 oz (114.5 kg)  12/25/22 269 lb 8 oz (122.2 kg)  05/18/21 273 lb (123.8 kg)    Physical Exam Vitals and nursing note reviewed.  Constitutional:      Appearance: He is well-developed.  HENT:     Head: Normocephalic and atraumatic.  Cardiovascular:     Rate and Rhythm: Normal rate and regular rhythm.     Heart sounds: Normal heart sounds. No murmur heard.    No friction rub. No gallop.  Pulmonary:     Effort: Pulmonary effort is normal. No tachypnea or respiratory distress.     Breath sounds: Normal breath sounds. No decreased breath sounds, wheezing, rhonchi or rales.  Chest:     Chest wall: No tenderness.  Abdominal:     General: Bowel sounds are normal.     Palpations: Abdomen is soft.  Musculoskeletal:        General: Normal range of motion.     Cervical back: Normal range of motion.  Skin:    General: Skin is  warm and dry.  Neurological:     Mental Status: He is alert and oriented to person, place, and time.     Coordination: Coordination normal.  Psychiatric:        Behavior: Behavior normal. Behavior is cooperative.        Thought Content: Thought content normal.        Judgment: Judgment normal.          Patient has been counseled extensively about nutrition and exercise as well as the importance of adherence with medications and regular follow-up. The patient was given clear instructions to go to ER or return to medical  center if symptoms don't improve, worsen or new problems develop. The patient verbalized understanding.   Follow-up: Return in about 3 months (around 08/18/2023).   Collins Dean, FNP-BC [facility] [place], [place] [phone]   05/19/2023, 10:56 PM

## 2023-05-20 ENCOUNTER — Other Ambulatory Visit: Payer: Self-pay

## 2023-05-20 LAB — CMP14+EGFR
ALT: 33 IU/L (ref 0–44)
AST: 26 IU/L (ref 0–40)
Albumin: 5 g/dL — ABNORMAL HIGH (ref 3.9–4.9)
Alkaline Phosphatase: 146 IU/L — ABNORMAL HIGH (ref 44–121)
BUN/Creatinine Ratio: 13 (ref 10–24)
BUN: 19 mg/dL (ref 8–27)
Bilirubin Total: 0.6 mg/dL (ref 0.0–1.2)
CO2: 21 mmol/L (ref 20–29)
Calcium: 10.3 mg/dL — ABNORMAL HIGH (ref 8.6–10.2)
Chloride: 94 mmol/L — ABNORMAL LOW (ref 96–106)
Creatinine, Ser: 1.48 mg/dL — ABNORMAL HIGH (ref 0.76–1.27)
Globulin, Total: 3.1 g/dL (ref 1.5–4.5)
Glucose: 320 mg/dL — ABNORMAL HIGH (ref 70–99)
Potassium: 4.1 mmol/L (ref 3.5–5.2)
Sodium: 134 mmol/L (ref 134–144)
Total Protein: 8.1 g/dL (ref 6.0–8.5)
eGFR: 52 mL/min/1.73 — ABNORMAL LOW

## 2023-05-20 LAB — CBC WITH DIFFERENTIAL/PLATELET
Basophils Absolute: 0.1 10*3/uL (ref 0.0–0.2)
Basos: 1 %
EOS (ABSOLUTE): 0.1 10*3/uL (ref 0.0–0.4)
Eos: 1 %
Hematocrit: 43.3 % (ref 37.5–51.0)
Hemoglobin: 14 g/dL (ref 13.0–17.7)
Immature Grans (Abs): 0 10*3/uL (ref 0.0–0.1)
Immature Granulocytes: 0 %
Lymphocytes Absolute: 2.6 10*3/uL (ref 0.7–3.1)
Lymphs: 28 %
MCH: 20.9 pg — ABNORMAL LOW (ref 26.6–33.0)
MCHC: 32.3 g/dL (ref 31.5–35.7)
MCV: 65 fL — ABNORMAL LOW (ref 79–97)
Monocytes Absolute: 0.6 10*3/uL (ref 0.1–0.9)
Monocytes: 6 %
Neutrophils Absolute: 6.1 10*3/uL (ref 1.4–7.0)
Neutrophils: 64 %
Platelets: 252 10*3/uL (ref 150–450)
RBC: 6.71 x10E6/uL — ABNORMAL HIGH (ref 4.14–5.80)
RDW: 17.1 % — ABNORMAL HIGH (ref 11.6–15.4)
WBC: 9.4 10*3/uL (ref 3.4–10.8)

## 2023-07-14 ENCOUNTER — Other Ambulatory Visit: Payer: Self-pay

## 2023-07-30 ENCOUNTER — Other Ambulatory Visit: Payer: Self-pay | Admitting: Nurse Practitioner

## 2023-07-30 ENCOUNTER — Other Ambulatory Visit: Payer: Self-pay

## 2023-07-30 DIAGNOSIS — K219 Gastro-esophageal reflux disease without esophagitis: Secondary | ICD-10-CM

## 2023-07-30 MED ORDER — OMEPRAZOLE 20 MG PO CPDR
20.0000 mg | DELAYED_RELEASE_CAPSULE | Freq: Every day | ORAL | 1 refills | Status: AC
  Filled 2023-07-30: qty 90, 90d supply, fill #0
  Filled 2024-01-14: qty 90, 90d supply, fill #1

## 2023-07-31 ENCOUNTER — Other Ambulatory Visit: Payer: Self-pay

## 2023-08-15 ENCOUNTER — Other Ambulatory Visit: Payer: Self-pay

## 2023-08-15 ENCOUNTER — Ambulatory Visit: Attending: Nurse Practitioner | Admitting: Nurse Practitioner

## 2023-08-15 ENCOUNTER — Other Ambulatory Visit: Payer: Self-pay | Admitting: Pharmacist

## 2023-08-15 ENCOUNTER — Encounter: Payer: Self-pay | Admitting: Nurse Practitioner

## 2023-08-15 VITALS — BP 128/77 | HR 86 | Resp 19 | Ht 76.0 in | Wt 237.0 lb

## 2023-08-15 DIAGNOSIS — I10 Essential (primary) hypertension: Secondary | ICD-10-CM | POA: Diagnosis not present

## 2023-08-15 DIAGNOSIS — E559 Vitamin D deficiency, unspecified: Secondary | ICD-10-CM

## 2023-08-15 DIAGNOSIS — B372 Candidiasis of skin and nail: Secondary | ICD-10-CM

## 2023-08-15 DIAGNOSIS — Z7984 Long term (current) use of oral hypoglycemic drugs: Secondary | ICD-10-CM | POA: Diagnosis not present

## 2023-08-15 DIAGNOSIS — Z794 Long term (current) use of insulin: Secondary | ICD-10-CM | POA: Diagnosis not present

## 2023-08-15 DIAGNOSIS — E119 Type 2 diabetes mellitus without complications: Secondary | ICD-10-CM

## 2023-08-15 DIAGNOSIS — E785 Hyperlipidemia, unspecified: Secondary | ICD-10-CM

## 2023-08-15 DIAGNOSIS — K146 Glossodynia: Secondary | ICD-10-CM

## 2023-08-15 LAB — POCT GLYCOSYLATED HEMOGLOBIN (HGB A1C): Hemoglobin A1C: 13.9 % — AB (ref 4.0–5.6)

## 2023-08-15 MED ORDER — INSULIN GLARGINE 100 UNIT/ML SOLOSTAR PEN
10.0000 [IU] | PEN_INJECTOR | Freq: Every day | SUBCUTANEOUS | 6 refills | Status: DC
  Filled 2023-08-15: qty 3, 28d supply, fill #0

## 2023-08-15 MED ORDER — ACCU-CHEK SOFTCLIX LANCETS MISC
6 refills | Status: AC
  Filled 2023-08-15: qty 100, 33d supply, fill #0

## 2023-08-15 MED ORDER — ACCU-CHEK GUIDE W/DEVICE KIT
PACK | 0 refills | Status: AC
  Filled 2023-08-15: qty 1, 30d supply, fill #0

## 2023-08-15 MED ORDER — ATORVASTATIN CALCIUM 80 MG PO TABS
80.0000 mg | ORAL_TABLET | Freq: Every day | ORAL | 2 refills | Status: AC
  Filled 2023-08-15 – 2023-11-14 (×2): qty 90, 90d supply, fill #0

## 2023-08-15 MED ORDER — ACCU-CHEK GUIDE TEST VI STRP
ORAL_STRIP | 6 refills | Status: AC
  Filled 2023-08-15: qty 100, 33d supply, fill #0

## 2023-08-15 MED ORDER — LIDOCAINE VISCOUS HCL 2 % MT SOLN
15.0000 mL | OROMUCOSAL | 1 refills | Status: AC | PRN
Start: 2023-08-15 — End: ?
  Filled 2023-08-15: qty 100, 5d supply, fill #0

## 2023-08-15 MED ORDER — NYSTATIN 100000 UNIT/GM EX CREA
1.0000 | TOPICAL_CREAM | Freq: Two times a day (BID) | CUTANEOUS | 1 refills | Status: AC
  Filled 2023-08-15: qty 60, 30d supply, fill #0

## 2023-08-15 MED ORDER — BLOOD GLUCOSE MONITOR KIT
PACK | 0 refills | Status: AC
  Filled 2023-08-15: qty 1, 30d supply, fill #0

## 2023-08-15 MED ORDER — METFORMIN HCL ER 750 MG PO TB24
750.0000 mg | ORAL_TABLET | Freq: Every day | ORAL | 1 refills | Status: AC
  Filled 2023-08-15: qty 90, 90d supply, fill #0
  Filled 2024-02-09: qty 90, 90d supply, fill #1

## 2023-08-15 MED ORDER — INSULIN GLARGINE 100 UNIT/ML SOLOSTAR PEN
10.0000 [IU] | PEN_INJECTOR | Freq: Every day | SUBCUTANEOUS | 6 refills | Status: DC
  Filled 2023-08-15: qty 3, 28d supply, fill #0
  Filled 2023-09-11: qty 3, 28d supply, fill #1
  Filled 2023-10-15: qty 3, 28d supply, fill #2
  Filled 2023-11-20: qty 3, 28d supply, fill #3

## 2023-08-15 NOTE — Progress Notes (Signed)
 Assessment & Plan:  Alexander Hunt was seen today for hypertension.  Diagnoses and all orders for this visit:  Diabetes mellitus treated with insulin  and oral medication (HCC) -     CMP14+EGFR -     POCT glycosylated hemoglobin (Hb A1C) -     metFORMIN  (GLUCOPHAGE -XR) 750 MG 24 hr tablet; Take 1 tablet (750 mg total) by mouth daily with breakfast. -     insulin  glargine (LANTUS ) 100 UNIT/ML Solostar Pen; Inject 10 Units into the skin daily at 10 pm. -     blood glucose meter kit and supplies KIT; Use as instructed. Check blood glucose level by fingerstick twice per day. A1c increased to 13.9, indicating poor glycemic control. Symptoms suggest hyperglycemia. Initiated metformin  and Lantus  insulin . Discussed metformin  side effects and glucose monitoring. Attempted Dexcom/Libre sensor coverage. - Start metformin  with the largest meal of the day. - Initiate Lantus  insulin  at night. - Order blood glucose monitoring twice daily. - Attempt to obtain Dexcom sensor coverage; if not covered, use finger stick method. - Schedule follow-up appointment in four weeks.   Primary hypertension Continue all antihypertensives as prescribed.  Reminded to bring in blood pressure log for follow  up appointment.  RECOMMENDATIONS: DASH/Mediterranean Diets are healthier choices for HTN.    Vitamin D  deficiency disease -     VITAMIN D  25 Hydroxy (Vit-D Deficiency, Fractures)  Tongue burning sensation -     lidocaine  (XYLOCAINE ) 2 % solution; Use as directed 15 mLs in the mouth or throat as needed for mouth pain. -     Vitamin B12 Burning sensation on tongue. No lesions or thrush. Possible vitamin B deficiency. - Order lab tests for vitamin B and D deficiencies. - Prescribe a numbing agent for the tongue.   Dyslipidemia, goal LDL below 70 -     atorvastatin  (LIPITOR) 80 MG tablet; Take 1 tablet (80 mg total) by mouth daily.  Candidiasis, intertrigo -     nystatin  cream (MYCOSTATIN ); Apply 1 Application  topically 2 (two) times daily. Moisture likely due to yeast infection secondary to diabetes.     Patient has been counseled on age-appropriate routine health concerns for screening and prevention. These are reviewed and up-to-date. Referrals have been placed accordingly. Immunizations are up-to-date or declined.    Subjective:   Chief Complaint  Patient presents with   Hypertension    Alexander Hunt 68 y.o. male presents to office today for follow up to diabetes, and with complaints of dry mouth and burning tongue sensation.  He has a past medical history of Hyperlipidemia, Hypertension (03/13/2016), and Prediabetes.   DM A1c has sharply increased from 6.9 to 13.9.  He is not currently taking any diabetes medications. No significant changes in diet. No increased consumption of sweets or alcohol. Drinks a lot of water Prefers boiled foods over fried foods Lab Results  Component Value Date   HGBA1C 13.9 (A) 08/15/2023   Hypertension management Adherent to blood pressure medication regimen amlodipine  10 mg, chlorthalidone . BP Readings from Last 3 Encounters:  08/15/23 128/77  05/19/23 112/77  12/25/22 (!) 148/88    Oral and mucosal symptoms - Dry mouth and burning tongue sensation for the past 2-3 weeks - Burning sensation on tongue described as feeling like a cut, persistent, localized to the tongue, not radiating to the throat - No recent consumption of hot beverages or foods that could have caused a burn Endorses pruritic rash also present in the groin area   Review of Systems  Constitutional:  Negative for fever, malaise/fatigue and weight loss.  HENT:  Negative for nosebleeds.        SEE HPI  Eyes: Negative.  Negative for blurred vision, double vision and photophobia.  Respiratory: Negative.  Negative for cough and shortness of breath.   Cardiovascular: Negative.  Negative for chest pain, palpitations and leg swelling.  Gastrointestinal: Negative.  Negative for  heartburn, nausea and vomiting.  Musculoskeletal: Negative.  Negative for myalgias.  Skin:  Positive for itching and rash.  Neurological: Negative.  Negative for dizziness, focal weakness, seizures and headaches.  Psychiatric/Behavioral: Negative.  Negative for suicidal ideas.     Past Medical History:  Diagnosis Date   Hyperlipidemia    Hypertension 03/13/2016   Prediabetes     Past Surgical History:  Procedure Laterality Date   NO PAST SURGERIES      Family History  Problem Relation Age of Onset   Diabetes Maternal Grandmother    Hypertension Maternal Grandmother    Colon cancer Neg Hx    Colon polyps Neg Hx    Rectal cancer Neg Hx    Stomach cancer Neg Hx    Esophageal cancer Neg Hx     Social History Reviewed with no changes to be made today.   Outpatient Medications Prior to Visit  Medication Sig Dispense Refill   amLODipine  (NORVASC ) 10 MG tablet Take 1 tablet (10 mg total) by mouth daily. 90 tablet 1   chlorthalidone  (HYGROTON ) 25 MG tablet Take 1 tablet (25 mg total) by mouth daily. FOR BLOOD PRESSURE. 90 tablet 1   ferrous gluconate  (FERGON) 324 MG tablet Take 1 tablet (324 mg total) by mouth daily. 90 tablet 1   omeprazole  (PRILOSEC) 20 MG capsule Take 1 capsule (20 mg total) by mouth daily for heartburn. 90 capsule 1   tadalafil  (CIALIS ) 5 MG tablet TAKE 2-4 TABLETS (10-20 MG TOTAL) BY MOUTH DAILY AS NEEDED FOR ERECTILE DYSFUNCTION. 20 tablet 6   atorvastatin  (LIPITOR) 80 MG tablet Take 1 tablet (80 mg total) by mouth daily. 90 tablet 2   gatifloxacin  (ZYMAXID ) 0.5 % SOLN Place 1 drop into the right eye 4 (four) times daily as directed. Store supside down (Patient not taking: Reported on 08/15/2023) 5 mL 1   ketorolac  (ACULAR ) 0.5 % ophthalmic solution Place 1 drop into the right eye 4 (four) times daily as directed. (Patient not taking: Reported on 08/15/2023) 5 mL 1   prednisoLONE  acetate (PRED FORTE ) 1 % ophthalmic suspension Place 1 drop into the right eye 4  (four) times daily for 5 days then stop. (Patient not taking: Reported on 08/15/2023) 5 mL 1   Facility-Administered Medications Prior to Visit  Medication Dose Route Frequency Provider Last Rate Last Admin   0.9 %  sodium chloride  infusion  500 mL Intravenous Continuous Avram Lupita BRAVO, MD        No Known Allergies     Objective:    BP 128/77 (BP Location: Left Arm, Patient Position: Sitting, Cuff Size: Normal)   Pulse 86   Resp 19   Ht 6' 4 (1.93 m)   Wt 237 lb (107.5 kg)   SpO2 100%   BMI 28.85 kg/m  Wt Readings from Last 3 Encounters:  08/15/23 237 lb (107.5 kg)  05/19/23 252 lb 6.4 oz (114.5 kg)  12/25/22 269 lb 8 oz (122.2 kg)    Physical Exam Vitals and nursing note reviewed.  Constitutional:      Appearance: He is well-developed.  HENT:  Head: Normocephalic and atraumatic.     Mouth/Throat:     Mouth: No oral lesions.     Tongue: No lesions. Tongue does not deviate from midline.  Cardiovascular:     Rate and Rhythm: Normal rate and regular rhythm.     Heart sounds: Normal heart sounds. No murmur heard.    No friction rub. No gallop.  Pulmonary:     Effort: Pulmonary effort is normal. No tachypnea or respiratory distress.     Breath sounds: Normal breath sounds. No decreased breath sounds, wheezing, rhonchi or rales.  Chest:     Chest wall: No tenderness.  Abdominal:     General: Bowel sounds are normal.     Palpations: Abdomen is soft.  Musculoskeletal:        General: Normal range of motion.     Cervical back: Normal range of motion.  Skin:    General: Skin is warm and dry.  Neurological:     Mental Status: He is alert and oriented to person, place, and time.     Coordination: Coordination normal.  Psychiatric:        Behavior: Behavior normal. Behavior is cooperative.        Thought Content: Thought content normal.        Judgment: Judgment normal.          Patient has been counseled extensively about nutrition and exercise as well as the  importance of adherence with medications and regular follow-up. The patient was given clear instructions to go to ER or return to medical center if symptoms don't improve, worsen or new problems develop. The patient verbalized understanding.   Follow-up: Return in about 4 weeks (around 09/12/2023) for meter check with me or LUKE.   Haze LELON Servant, FNP-BC [facility] [place], [place] [phone]   08/15/2023, 4:36 PM

## 2023-08-16 LAB — CMP14+EGFR
ALT: 33 IU/L (ref 0–44)
AST: 27 IU/L (ref 0–40)
Albumin: 4.7 g/dL (ref 3.9–4.9)
Alkaline Phosphatase: 164 IU/L — ABNORMAL HIGH (ref 44–121)
BUN/Creatinine Ratio: 10 (ref 10–24)
BUN: 13 mg/dL (ref 8–27)
Bilirubin Total: 0.5 mg/dL (ref 0.0–1.2)
CO2: 22 mmol/L (ref 20–29)
Calcium: 10.2 mg/dL (ref 8.6–10.2)
Chloride: 90 mmol/L — ABNORMAL LOW (ref 96–106)
Creatinine, Ser: 1.28 mg/dL — ABNORMAL HIGH (ref 0.76–1.27)
Globulin, Total: 2.8 g/dL (ref 1.5–4.5)
Glucose: 484 mg/dL — ABNORMAL HIGH (ref 70–99)
Potassium: 4.6 mmol/L (ref 3.5–5.2)
Sodium: 129 mmol/L — ABNORMAL LOW (ref 134–144)
Total Protein: 7.5 g/dL (ref 6.0–8.5)
eGFR: 62 mL/min/1.73 (ref >59)

## 2023-08-16 LAB — VITAMIN B12: Vitamin B-12: 913 pg/mL (ref 232–1245)

## 2023-08-16 LAB — VITAMIN D 25 HYDROXY (VIT D DEFICIENCY, FRACTURES): Vit D, 25-Hydroxy: 13.3 ng/mL — ABNORMAL LOW (ref 30.0–100.0)

## 2023-08-18 ENCOUNTER — Ambulatory Visit: Admitting: Nurse Practitioner

## 2023-08-18 ENCOUNTER — Ambulatory Visit: Payer: Self-pay | Admitting: Nurse Practitioner

## 2023-08-18 DIAGNOSIS — E559 Vitamin D deficiency, unspecified: Secondary | ICD-10-CM

## 2023-08-18 MED ORDER — VITAMIN D (ERGOCALCIFEROL) 1.25 MG (50000 UNIT) PO CAPS
50000.0000 [IU] | ORAL_CAPSULE | ORAL | 1 refills | Status: DC
  Filled 2023-08-18: qty 12, 84d supply, fill #0

## 2023-08-19 ENCOUNTER — Other Ambulatory Visit: Payer: Self-pay

## 2023-08-26 ENCOUNTER — Other Ambulatory Visit: Payer: Self-pay | Admitting: Nurse Practitioner

## 2023-08-26 DIAGNOSIS — E119 Type 2 diabetes mellitus without complications: Secondary | ICD-10-CM

## 2023-08-26 NOTE — Telephone Encounter (Unsigned)
 Copied from CRM [id number]. Topic: Clinical - Medication Refill >> Aug 26, 2023  9:56 AM Tiffini S wrote: Medication: Needles  Has the patient contacted their pharmacy? No (Agent: If no, request that the patient contact the pharmacy for the refill. If patient does not wish to contact the pharmacy document the reason why and proceed with request.) (Agent: If yes, when and what did the pharmacy advise?)  This is the patient's preferred pharmacy:  [facility] [address], [address] [place] [address] Phone: [phone] Fax: [phone]  Is this the correct pharmacy for this prescription? Yes If no, delete pharmacy and type the correct one.   Has the prescription been filled recently? Yes  Is the patient out of the medication? Yes, did not give the patient any when he picked up the medication     Has the patient been seen for an appointment in the last year OR does the patient have an upcoming appointment? Yes  Can we respond through MyChart? No, patient asked for phone call at [phone]  Agent: Please be advised that Rx refills may take up to 3 business days. We ask that you follow-up with your pharmacy.

## 2023-08-26 NOTE — Telephone Encounter (Deleted)
 Copied from CRM [id number]. Topic: Clinical - Medication Refill >> Aug 26, 2023  9:56 AM Tiffini S wrote: Medication: Needles  Has the patient contacted their pharmacy? No (Agent: If no, request that the patient contact the pharmacy for the refill. If patient does not wish to contact the pharmacy document the reason why and proceed with request.) (Agent: If yes, when and what did the pharmacy advise?)  This is the patient's preferred pharmacy:  [facility] [address], [address] [place] [address] Phone: [phone] Fax: [phone]  Is this the correct pharmacy for this prescription? Yes If no, delete pharmacy and type the correct one.   Has the prescription been filled recently? Yes  Is the patient out of the medication? Yes, did not give the patient any when he picked up the medication     Has the patient been seen for an appointment in the last year OR does the patient have an upcoming appointment? Yes  Can we respond through MyChart? No, patient asked for phone call at [phone]  Agent: Please be advised that Rx refills may take up to 3 business days. We ask that you follow-up with your pharmacy.

## 2023-08-27 ENCOUNTER — Other Ambulatory Visit: Payer: Self-pay

## 2023-08-28 ENCOUNTER — Other Ambulatory Visit: Payer: Self-pay

## 2023-09-09 ENCOUNTER — Telehealth: Payer: Self-pay | Admitting: Nurse Practitioner

## 2023-09-09 NOTE — Telephone Encounter (Signed)
 Called pt to confirm appt for 8/8 LVM

## 2023-09-11 ENCOUNTER — Other Ambulatory Visit: Payer: Self-pay | Admitting: Nurse Practitioner

## 2023-09-11 ENCOUNTER — Encounter: Payer: Self-pay | Admitting: Pharmacist

## 2023-09-11 ENCOUNTER — Ambulatory Visit: Attending: Family Medicine | Admitting: Pharmacist

## 2023-09-11 ENCOUNTER — Other Ambulatory Visit: Payer: Self-pay

## 2023-09-11 ENCOUNTER — Telehealth: Payer: Self-pay | Admitting: Pharmacist

## 2023-09-11 DIAGNOSIS — Z794 Long term (current) use of insulin: Secondary | ICD-10-CM

## 2023-09-11 DIAGNOSIS — E119 Type 2 diabetes mellitus without complications: Secondary | ICD-10-CM

## 2023-09-11 DIAGNOSIS — Z7984 Long term (current) use of oral hypoglycemic drugs: Secondary | ICD-10-CM | POA: Diagnosis not present

## 2023-09-11 DIAGNOSIS — B379 Candidiasis, unspecified: Secondary | ICD-10-CM

## 2023-09-11 MED ORDER — INSUPEN PEN NEEDLES 32G X 4 MM MISC
1 refills | Status: AC
  Filled 2023-09-11: qty 100, 90d supply, fill #0
  Filled 2024-03-11: qty 100, 90d supply, fill #1

## 2023-09-11 MED ORDER — CLOTRIMAZOLE 1 % EX CREA
1.0000 | TOPICAL_CREAM | Freq: Two times a day (BID) | CUTANEOUS | 0 refills | Status: AC
  Filled 2023-09-11: qty 60, 30d supply, fill #0

## 2023-09-11 NOTE — Progress Notes (Signed)
 S:     No chief complaint on file.  68 y.o. male who presents for diabetes evaluation, education, and management. Patient arrives in good spirits and presents without any assistance.  Patient was referred and last seen by Primary Care Provider, Haze Servant, on 08/15/2023. A1c at that visit was 13.9 (from 6.9 prior). Patient was started on metformin  750 mg XR daily + Lantus  10 units daily.   PMH is significant for T2DM, HTN, HLD. Patient reports Diabetes was diagnosed in 2022. Managed previously with lifestyle. Most recently, blood work revealed an A1c of 13.9% last month. He does not appear to have ever been hospitalized for DM. Denies any hx of pancreatitis or thyroid  cancer. No known hx of clinical ASCVD, CHF, or CKD.  Family/Social History:  -Fhx: DM, HTN -Tobacco: current some day smoker  -Alcohol: none reported   Current diabetes medications include: Lantus  10 units daily, metformin  750 mg XR daily  Patient reports adherence to taking all medications as prescribed.   Insurance coverage: [employer]  Patient denies hypoglycemic events.  Reported home fasting blood sugars: reports 90s - 110s  Reported 2 hour post-meal/random blood sugars: none.  Patient reports improvement in polyuria, polydipsia since starting insulin  last month.    Patient denies neuropathy (nerve pain). Patient denies visual changes. Patient reports self foot exams.   Patient reported dietary habits:  -Tries to follow diabetic diet. Especially since last month's visit.   Patient-reported exercise habits: none reported   O:  No CGM or GM present today.   Lab Results  Component Value Date   HGBA1C 13.9 (A) 08/15/2023   There were no vitals filed for this visit.  Lipid Panel     Component Value Date/Time   CHOL 153 12/25/2022 1532   TRIG 211 (H) 12/25/2022 1532   HDL 34 (L) 12/25/2022 1532   CHOLHDL 4.5 12/25/2022 1532   CHOLHDL 5.3 (H) 03/13/2016 1105   VLDL 25 03/13/2016 1105   LDLCALC 83  12/25/2022 1532    Clinical Atherosclerotic Cardiovascular Disease (ASCVD): No  The 10-year ASCVD risk score (Arnett DK, et al., 2019) is: 46.4%   Values used to calculate the score:     Age: 53 years     Clincally relevant sex: Male     Is Non-Hispanic African American: Yes     Diabetic: Yes     Tobacco smoker: Yes     Systolic Blood Pressure: 128 mmHg     Is BP treated: Yes     HDL Cholesterol: 34 mg/dL     Total Cholesterol: 153 mg/dL   Patient is participating in a Managed Medicaid Plan: No   A/P: Diabetes longstanding currently uncontrolled. Patient reports improvement in hyperglycemia-associated symptomology since starting insulin . Denies any hypoglycemia currently but is able to verbalize appropriate hypoglycemia management plan. Medication adherence appears to be appropriate. His reported sugars sound great but I have no way of verifying this. I have asked him to continue his current plan and return in 1 month for review of his glucometer.  -Continued current regimen.  -Recommended he continue checking home CBG readings and bring his meter for review next month.  -Patient educated on purpose, proper use, and potential adverse effects of insulin , metformin .  -Extensively discussed pathophysiology of diabetes, recommended lifestyle interventions, dietary effects on blood sugar control.  -Counseled on s/sx of and management of hypoglycemia.  -Next A1c anticipated 11/2023.   Written patient instructions provided. Patient verbalized understanding of treatment plan.  Total time in  face to face counseling 30 minutes.    Follow-up:  Pharmacist in 1 month  Herlene Fleeta Morris, PharmD, [place], CPP Clinical Pharmacist [facility] [phone]

## 2023-09-11 NOTE — Telephone Encounter (Signed)
 Patient endorses pruritic rash present in the groin area. Uncircumcised male describing S/sx of candidiasis as well. Nystatin  ineffective. Would it be possible to rx Diflucan for him? Pt describes similar symptoms treated with a single pink pill in the past.

## 2023-09-11 NOTE — Telephone Encounter (Signed)
 Clotrimazole  sent. If ineffective he needs to schedule a visit in office

## 2023-09-12 ENCOUNTER — Other Ambulatory Visit: Payer: Self-pay

## 2023-09-12 ENCOUNTER — Ambulatory Visit: Admitting: Pharmacist

## 2023-09-12 NOTE — Telephone Encounter (Signed)
Unable to reach patient by phone. Unable to leave voicemail.

## 2023-09-19 ENCOUNTER — Emergency Department (HOSPITAL_BASED_OUTPATIENT_CLINIC_OR_DEPARTMENT_OTHER)
Admission: EM | Admit: 2023-09-19 | Discharge: 2023-09-19 | Disposition: A | Discharge status: Home / Self Care | Source: Ambulatory Visit | Attending: Emergency Medicine | Admitting: Emergency Medicine

## 2023-09-19 ENCOUNTER — Other Ambulatory Visit: Payer: Self-pay

## 2023-09-19 DIAGNOSIS — Z794 Long term (current) use of insulin: Secondary | ICD-10-CM | POA: Insufficient documentation

## 2023-09-19 DIAGNOSIS — Z79899 Other long term (current) drug therapy: Secondary | ICD-10-CM | POA: Insufficient documentation

## 2023-09-19 DIAGNOSIS — N3000 Acute cystitis without hematuria: Secondary | ICD-10-CM | POA: Diagnosis not present

## 2023-09-19 DIAGNOSIS — E1165 Type 2 diabetes mellitus with hyperglycemia: Secondary | ICD-10-CM | POA: Insufficient documentation

## 2023-09-19 DIAGNOSIS — Z7984 Long term (current) use of oral hypoglycemic drugs: Secondary | ICD-10-CM | POA: Insufficient documentation

## 2023-09-19 DIAGNOSIS — I10 Essential (primary) hypertension: Secondary | ICD-10-CM | POA: Insufficient documentation

## 2023-09-19 LAB — URINALYSIS, ROUTINE W REFLEX MICROSCOPIC
Bilirubin Urine: NEGATIVE
Glucose, UA: >1000 mg/dL — AB
Ketones, ur: 40 mg/dL — AB
Nitrite: POSITIVE — AB
Specific Gravity, Urine: 1.025 (ref 1.005–1.030)
pH: 5.5 (ref 5.0–8.0)

## 2023-09-19 LAB — CBC
HCT: 36.2 % — ABNORMAL LOW (ref 39.0–52.0)
Hemoglobin: 11.8 g/dL — ABNORMAL LOW (ref 13.0–17.0)
MCH: 20.8 pg — ABNORMAL LOW (ref 26.0–34.0)
MCHC: 32.6 g/dL (ref 30.0–36.0)
MCV: 63.8 fL — ABNORMAL LOW (ref 80.0–100.0)
Platelets: 194 K/uL (ref 150–400)
RBC: 5.67 MIL/uL (ref 4.22–5.81)
RDW: 15.3 % (ref 11.5–15.5)
WBC: 11 K/uL — ABNORMAL HIGH (ref 4.0–10.5)
nRBC: 0 % (ref 0.0–0.2)

## 2023-09-19 LAB — I-STAT VENOUS BLOOD GAS, ED
Acid-Base Excess: 0 mmol/L (ref 0.0–2.0)
Bicarbonate: 24.5 mmol/L (ref 20.0–28.0)
Calcium, Ion: 1.12 mmol/L — ABNORMAL LOW (ref 1.15–1.40)
HCT: 36 % — ABNORMAL LOW (ref 39.0–52.0)
Hemoglobin: 12.2 g/dL — ABNORMAL LOW (ref 13.0–17.0)
O2 Saturation: 51 %
Patient temperature: 98
Potassium: 4 mmol/L (ref 3.5–5.1)
Sodium: 131 mmol/L — ABNORMAL LOW (ref 135–145)
TCO2: 26 mmol/L (ref 22–32)
pCO2, Ven: 38.2 mmHg — ABNORMAL LOW (ref 44–60)
pH, Ven: 7.414 (ref 7.25–7.43)
pO2, Ven: 26 mmHg — CL (ref 32–45)

## 2023-09-19 LAB — BASIC METABOLIC PANEL WITH GFR
Anion gap: 21 — ABNORMAL HIGH (ref 5–15)
BUN: 13 mg/dL (ref 8–23)
CO2: 20 mmol/L — ABNORMAL LOW (ref 22–32)
Calcium: 10.1 mg/dL (ref 8.9–10.3)
Chloride: 88 mmol/L — ABNORMAL LOW (ref 98–111)
Creatinine, Ser: 1.29 mg/dL — ABNORMAL HIGH (ref 0.61–1.24)
GFR, Estimated: >60 mL/min (ref >60)
Glucose, Bld: 509 mg/dL (ref 70–99)
Potassium: 4.1 mmol/L (ref 3.5–5.1)
Sodium: 129 mmol/L — ABNORMAL LOW (ref 135–145)

## 2023-09-19 LAB — CBG MONITORING, ED
Glucose-Capillary: 517 mg/dL (ref 70–99)
Glucose-Capillary: 365 mg/dL — ABNORMAL HIGH (ref 70–99)

## 2023-09-19 MED ORDER — CEPHALEXIN 250 MG PO CAPS
500.0000 mg | ORAL_CAPSULE | Freq: Once | ORAL | Status: AC
  Administered 2023-09-19: 500 mg via ORAL
  Filled 2023-09-19: qty 2

## 2023-09-19 MED ORDER — SODIUM CHLORIDE 0.9 % IV BOLUS
1000.0000 mL | Freq: Once | INTRAVENOUS | Status: AC
  Administered 2023-09-19: 1000 mL via INTRAVENOUS

## 2023-09-19 MED ORDER — INSULIN ASPART 100 UNIT/ML IJ SOLN
10.0000 [IU] | Freq: Once | INTRAMUSCULAR | Status: AC
  Administered 2023-09-19: 8 [IU] via SUBCUTANEOUS

## 2023-09-19 MED ORDER — CEPHALEXIN 500 MG PO CAPS
500.0000 mg | ORAL_CAPSULE | Freq: Four times a day (QID) | ORAL | 0 refills | Status: DC
  Filled 2023-09-19: qty 28, 7d supply, fill #0

## 2023-09-19 NOTE — ED Notes (Signed)
 Glucose 509.

## 2023-09-19 NOTE — ED Triage Notes (Signed)
 Pt is diabetic and states he has been peeing too much and drinking a lot due to mouth staying dry.

## 2023-09-19 NOTE — ED Triage Notes (Addendum)
 PT denies any complaints. He was at work and got overheated. His employer took his BP and said it was high and advised him to come be further evaluated at ER. Pt denies any pain or discomfort. Is on BP medication and took them this morning. No headache.

## 2023-09-19 NOTE — Discharge Instructions (Addendum)
 1.  Fill your prescription for antibiotics and take antibiotics 4 times a day as prescribed.  Finish your prescription. 2.  It is very important that you understand your insulin  administration and how to monitor your blood sugar.  Contact your doctor about educational classes and nursing sessions to increase your understanding of your medications and home management of your symptoms.  Your discharge instructions also included information on Lantus  and blood glucose monitoring. 3.  Return to emergency room immediately if you have new concerning or worsening symptoms.

## 2023-09-19 NOTE — ED Provider Notes (Signed)
 [place] EMERGENCY DEPARTMENT AT [facility] Provider Note   CSN: [id number] Arrival date & time: 09/19/23  1209     Patient presents with: Hypertension and Hyperglycemia   Alexander Hunt is a 68 y.o. male.   HPI Patient reports that he was at work and felt like things were going all right.  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  He reports he did not feel he was really overheating because he was going in and out of the air conditioned space.  He reports he was taking a lot of breaks and sitting down or resting.  He did not feel like he was resting because he was feeling lightheaded or dizzy he did not have shortness of breath or chest pain.  He reports he was just taking some breaks.  Apparently his coworkers thought that that seemed like he was unwell and recommended he get his blood pressure checked and get checked by the [facility] provider where his blood pressure was identified to be elevated.  Patient reports he is mostly compliant with his medications but did not take his insulin  dose today.  He reports he has had some trouble using his blood sugar monitor and has been a bit irregular with managing his diabetes.  He is not sure what his sugars have been running lately.  He reports he is compliant with his blood pressure medications.  He denies he is felt ill recently no fevers no chills no general malaise.    Prior to Admission medications   Medication Sig Start Date End Date Taking? Authorizing Provider  cephALEXin  (KEFLEX ) 500 MG capsule Take 1 capsule (500 mg total) by mouth 4 (four) times daily. 09/19/23  Yes Armenta Canning, MD  Accu-Chek Softclix Lancets lancets Use to check blood sugar 3 times daily. 08/15/23   Newlin, Enobong, MD  amLODipine  (NORVASC ) 10 MG tablet Take 1 tablet (10 mg total) by mouth daily. 05/19/23   Fleming, Zelda W, NP  atorvastatin  (LIPITOR) 80 MG tablet Take 1 tablet (80 mg total)  by mouth daily. 08/15/23   Theotis Haze ORN, NP  blood glucose meter kit and supplies KIT Use as instructed. Check blood glucose level by fingerstick twice per day. 08/15/23   Fleming, Zelda W, NP  Blood Glucose Monitoring Suppl (ACCU-CHEK GUIDE) w/Device KIT Use to check blood sugar 3 times daily. 08/15/23   Newlin, Enobong, MD  chlorthalidone  (HYGROTON ) 25 MG tablet Take 1 tablet (25 mg total) by mouth daily. FOR BLOOD PRESSURE. 05/19/23   Fleming, Zelda W, NP  clotrimazole  (CLOTRIMAZOLE  ANTI-FUNGAL) 1 % cream Apply 1 Application topically 2 (two) times daily. 09/11/23   Fleming, Zelda W, NP  ferrous gluconate  (FERGON) 324 MG tablet Take 1 tablet (324 mg total) by mouth daily. 12/29/22   Fleming, Zelda W, NP  gatifloxacin  (ZYMAXID ) 0.5 % SOLN Place 1 drop into the right eye 4 (four) times daily as directed. Store supside down Patient not taking: Reported on 08/15/2023 02/28/23   Austin Olam CROME, MD  glucose blood (ACCU-CHEK GUIDE TEST) test strip Use to check blood sugar 3 times daily. 08/15/23   Newlin, Enobong, MD  insulin  glargine (LANTUS ) 100 UNIT/ML Solostar Pen Inject 10 Units into the skin daily at 10 pm. 08/15/23   Fleming, Zelda W, NP  Insulin  Pen Needle (INSUPEN PEN NEEDLES) 32G X 4 MM MISC Use to inject insulin  once daily. 09/11/23   Newlin, Enobong, MD  ketorolac  (ACULAR ) 0.5 %  ophthalmic solution Place 1 drop into the right eye 4 (four) times daily as directed. Patient not taking: Reported on 08/15/2023 02/28/23   Austin Olam CROME, MD  lidocaine  (XYLOCAINE ) 2 % solution Use as directed 15 mLs in the mouth or throat as needed for mouth pain. 08/15/23   Fleming, Zelda W, NP  metFORMIN  (GLUCOPHAGE -XR) 750 MG 24 hr tablet Take 1 tablet (750 mg total) by mouth daily with breakfast. 08/15/23   Fleming, Zelda W, NP  nystatin  cream (MYCOSTATIN ) Apply 1 Application topically 2 (two) times daily. 08/15/23   Fleming, Zelda W, NP  omeprazole  (PRILOSEC) 20 MG capsule Take 1 capsule (20 mg total) by mouth daily for  heartburn. 07/30/23   Fleming, Zelda W, NP  prednisoLONE  acetate (PRED FORTE ) 1 % ophthalmic suspension Place 1 drop into the right eye 4 (four) times daily for 5 days then stop. Patient not taking: Reported on 08/15/2023 02/28/23     tadalafil  (CIALIS ) 5 MG tablet TAKE 2-4 TABLETS (10-20 MG TOTAL) BY MOUTH DAILY AS NEEDED FOR ERECTILE DYSFUNCTION. 12/25/22   Theotis Haze ORN, NP  Vitamin D , Ergocalciferol , (DRISDOL ) 1.25 MG (50000 UNIT) CAPS capsule Take 1 capsule (50,000 Units total) by mouth every 7 (seven) days. 08/18/23   Fleming, Zelda W, NP    Allergies: Patient has no known allergies.    Review of Systems  Updated Vital Signs BP 139/79   Pulse 82   Temp 98 F (36.7 C) (Oral)   Resp 16   Ht 6' 4 (1.93 m)   Wt 81.6 kg   SpO2 97%   BMI 21.91 kg/m   Physical Exam Constitutional:      Comments: Alert nontoxic clinically well in appearance.  No respiratory distress.  HENT:     Head: Normocephalic and atraumatic.     Mouth/Throat:     Mouth: Mucous membranes are moist.     Pharynx: Oropharynx is clear.  Eyes:     Extraocular Movements: Extraocular movements intact.     Conjunctiva/sclera: Conjunctivae normal.  Cardiovascular:     Rate and Rhythm: Normal rate and regular rhythm.  Pulmonary:     Effort: Pulmonary effort is normal.     Breath sounds: Normal breath sounds.  Abdominal:     General: There is no distension.     Palpations: Abdomen is soft.     Tenderness: There is no abdominal tenderness. There is no guarding.  Musculoskeletal:        General: No swelling or tenderness. Normal range of motion.     Right lower leg: No edema.     Left lower leg: No edema.  Skin:    General: Skin is warm and dry.  Neurological:     General: No focal deficit present.     Mental Status: He is oriented to person, place, and time.     Motor: No weakness.     Coordination: Coordination normal.  Psychiatric:        Mood and Affect: Mood normal.     (all labs ordered are  listed, but only abnormal results are displayed) Labs Reviewed  BASIC METABOLIC PANEL WITH GFR - Abnormal; Notable for the following components:      Result Value   Sodium 129 (*)    Chloride 88 (*)    CO2 20 (*)    Glucose, Bld 509 (*)    Creatinine, Ser 1.29 (*)    Anion gap 21 (*)    All other components within normal limits  CBC - Abnormal; Notable for the following components:   WBC 11.0 (*)    Hemoglobin 11.8 (*)    HCT 36.2 (*)    MCV 63.8 (*)    MCH 20.8 (*)    All other components within normal limits  URINALYSIS, ROUTINE W REFLEX MICROSCOPIC - Abnormal; Notable for the following components:   Color, Urine COLORLESS (*)    Glucose, UA >1,000 (*)    Hgb urine dipstick SMALL (*)    Ketones, ur 40 (*)    Protein, ur TRACE (*)    Nitrite POSITIVE (*)    Leukocytes,Ua TRACE (*)    Bacteria, UA RARE (*)    All other components within normal limits  CBG MONITORING, ED - Abnormal; Notable for the following components:   Glucose-Capillary 517 (*)    All other components within normal limits  I-STAT VENOUS BLOOD GAS, ED - Abnormal; Notable for the following components:   pCO2, Ven 38.2 (*)    pO2, Ven 26 (*)    Sodium 131 (*)    Calcium , Ion 1.12 (*)    HCT 36.0 (*)    Hemoglobin 12.2 (*)    All other components within normal limits  URINE CULTURE  CBG MONITORING, ED    EKG: None  Radiology: No results found.   Procedures   Medications Ordered in the ED  insulin  aspart (novoLOG ) injection 10 Units (has no administration in time range)  cephALEXin  (KEFLEX ) capsule 500 mg (has no administration in time range)  sodium chloride  0.9 % bolus 1,000 mL (0 mLs Intravenous Stopped 09/19/23 1504)                                    Medical Decision Making Amount and/or Complexity of Data Reviewed Labs: ordered.  Risk OTC drugs.   Patient presents as outlined.  Clinically he is well in appearance.  Patient has very limited complaints.  He feels this is more the  observations of people at work.  Initial blood pressure, and was 152/86.  Repeat blood pressures have been normotensive 130s over 70s.  Patient's blood sugar is significantly elevated at 509.  However, GFR normal at greater than 60.  Venous gas 7.4.  At this time no signs of DKA.  Patient is rehydrated with 1 L of fluids and given regular insulin .  He is counseled on compliance with his Lantus  and close follow-up with his PCP.  He may need additional diabetic education.  Incidentally urinalysis does test positive with nitrite and leuk esterase 21-50 WBCs with good specimen.  Given he is hyperglycemic and this may be precipitated by infection we will opt to treat urinalysis and obtain culture.  Patient however is nontoxic.  No hypotension no fever no confusion no signs of need for hospitalization at this time.  Home care and return precautions reviewed.     Final diagnoses:  Hyperglycemia due to diabetes mellitus (HCC)  Acute cystitis without hematuria    ED Discharge Orders          Ordered    cephALEXin  (KEFLEX ) 500 MG capsule  4 times daily        09/19/23 1558               Armenta Canning, MD 09/19/23 [phone]

## 2023-09-21 LAB — URINE CULTURE: Culture: >=100000 — AB

## 2023-09-22 ENCOUNTER — Telehealth (HOSPITAL_BASED_OUTPATIENT_CLINIC_OR_DEPARTMENT_OTHER): Payer: Self-pay | Admitting: *Deleted

## 2023-09-22 ENCOUNTER — Other Ambulatory Visit: Payer: Self-pay

## 2023-09-22 NOTE — Telephone Encounter (Signed)
 Post ED Visit - Positive Culture Follow-up  Culture report reviewed by antimicrobial stewardship pharmacist: [facility] [x]  Koren Or, Pharm.D. []  Venetia Gully, Pharm.D., BCPS AQ-ID []  Garrel Crews, Pharm.D., BCPS []  Almarie Lunger, [address].D., BCPS []  [place], [address].D., BCPS, AAHIVP []  Rosaline Bihari, Pharm.D., BCPS, AAHIVP []  Vernell Meier, PharmD, BCPS []  Latanya Hint, PharmD, BCPS []  Donald Medley, PharmD, BCPS []  Rocky Bold, PharmD []  Dorothyann Alert, PharmD, BCPS []  Morene Babe, PharmD  [facility] []  Rosaline Edison, PharmD []  Romona Bliss, PharmD []  Dolphus Roller, PharmD []  Veva Seip, Rph []  Vernell Daunt) Leonce, PharmD []  Eva Allis, PharmD []  Rosaline Millet, PharmD []  Iantha Batch, PharmD []  Arvin Gauss, PharmD []  Wanda Hasting, PharmD []  Ronal Rav, PharmD []  Rocky Slade, PharmD []  Bard Jeans, PharmD   Positive urine culture Treated with cephalexin , organism sensitive to the same and no further patient follow-up is required at this time.  Lorita Barnie Pereyra 09/22/2023, 9:30 AM

## 2023-09-30 ENCOUNTER — Other Ambulatory Visit: Payer: Self-pay

## 2023-09-30 ENCOUNTER — Emergency Department (HOSPITAL_COMMUNITY)

## 2023-09-30 ENCOUNTER — Encounter (HOSPITAL_COMMUNITY): Payer: Self-pay

## 2023-09-30 ENCOUNTER — Emergency Department (HOSPITAL_COMMUNITY)
Admission: EM | Admit: 2023-09-30 | Discharge: 2023-09-30 | Disposition: A | Discharge status: Home / Self Care | Source: Ambulatory Visit | Attending: Emergency Medicine | Admitting: Emergency Medicine

## 2023-09-30 ENCOUNTER — Ambulatory Visit (HOSPITAL_COMMUNITY): Admission: EM | Admit: 2023-09-30 | Discharge: 2023-09-30 | Discharge status: Against Medical Advice

## 2023-09-30 DIAGNOSIS — I10 Essential (primary) hypertension: Secondary | ICD-10-CM | POA: Insufficient documentation

## 2023-09-30 DIAGNOSIS — Z79899 Other long term (current) drug therapy: Secondary | ICD-10-CM | POA: Diagnosis not present

## 2023-09-30 DIAGNOSIS — R739 Hyperglycemia, unspecified: Secondary | ICD-10-CM

## 2023-09-30 DIAGNOSIS — E1165 Type 2 diabetes mellitus with hyperglycemia: Secondary | ICD-10-CM | POA: Diagnosis not present

## 2023-09-30 DIAGNOSIS — Z794 Long term (current) use of insulin: Secondary | ICD-10-CM | POA: Insufficient documentation

## 2023-09-30 DIAGNOSIS — N400 Enlarged prostate without lower urinary tract symptoms: Secondary | ICD-10-CM | POA: Insufficient documentation

## 2023-09-30 DIAGNOSIS — Z7984 Long term (current) use of oral hypoglycemic drugs: Secondary | ICD-10-CM | POA: Diagnosis not present

## 2023-09-30 DIAGNOSIS — K5900 Constipation, unspecified: Secondary | ICD-10-CM | POA: Diagnosis not present

## 2023-09-30 DIAGNOSIS — R109 Unspecified abdominal pain: Secondary | ICD-10-CM | POA: Diagnosis present

## 2023-09-30 LAB — CBC WITH DIFFERENTIAL/PLATELET
Abs Granulocyte: 8.6 K/uL — ABNORMAL HIGH (ref 1.5–6.5)
Abs Immature Granulocytes: 0.07 K/uL (ref 0.00–0.07)
Basophils Absolute: 0.1 K/uL (ref 0.0–0.1)
Basophils Relative: 1 %
Eosinophils Absolute: 0.1 K/uL (ref 0.0–0.5)
Eosinophils Relative: 1 %
HCT: 43.2 % (ref 39.0–52.0)
Hemoglobin: 13.5 g/dL (ref 13.0–17.0)
Immature Granulocytes: 1 %
Lymphocytes Relative: 17 %
Lymphs Abs: 2 K/uL (ref 0.7–4.0)
MCH: 20.3 pg — ABNORMAL LOW (ref 26.0–34.0)
MCHC: 31.3 g/dL (ref 30.0–36.0)
MCV: 65 fL — ABNORMAL LOW (ref 80.0–100.0)
Monocytes Absolute: 0.6 K/uL (ref 0.1–1.0)
Monocytes Relative: 5 %
Neutro Abs: 8.6 K/uL — ABNORMAL HIGH (ref 1.7–7.7)
Neutrophils Relative %: 75 %
Platelets: 298 K/uL (ref 150–400)
RBC: 6.65 MIL/uL — ABNORMAL HIGH (ref 4.22–5.81)
RDW: 15.7 % — ABNORMAL HIGH (ref 11.5–15.5)
WBC: 11.3 K/uL — ABNORMAL HIGH (ref 4.0–10.5)
nRBC: 0 % (ref 0.0–0.2)

## 2023-09-30 LAB — URINALYSIS, ROUTINE W REFLEX MICROSCOPIC
Bacteria, UA: NONE SEEN
Bilirubin Urine: NEGATIVE
Glucose, UA: >=500 mg/dL — AB
Ketones, ur: NEGATIVE mg/dL
Leukocytes,Ua: NEGATIVE
Nitrite: NEGATIVE
Protein, ur: NEGATIVE mg/dL
Specific Gravity, Urine: 1.027 (ref 1.005–1.030)
pH: 6 (ref 5.0–8.0)

## 2023-09-30 LAB — COMPREHENSIVE METABOLIC PANEL WITH GFR
ALT: 32 U/L (ref 0–44)
AST: 21 U/L (ref 15–41)
Albumin: 3.9 g/dL (ref 3.5–5.0)
Alkaline Phosphatase: 114 U/L (ref 38–126)
Anion gap: 13 (ref 5–15)
BUN: 15 mg/dL (ref 8–23)
CO2: 27 mmol/L (ref 22–32)
Calcium: 9.9 mg/dL (ref 8.9–10.3)
Chloride: 86 mmol/L — ABNORMAL LOW (ref 98–111)
Creatinine, Ser: 1.38 mg/dL — ABNORMAL HIGH (ref 0.61–1.24)
GFR, Estimated: 56 mL/min — ABNORMAL LOW (ref >60)
Glucose, Bld: 435 mg/dL — ABNORMAL HIGH (ref 70–99)
Potassium: 4.1 mmol/L (ref 3.5–5.1)
Sodium: 126 mmol/L — ABNORMAL LOW (ref 135–145)
Total Bilirubin: 1 mg/dL (ref 0.0–1.2)
Total Protein: 8.3 g/dL — ABNORMAL HIGH (ref 6.5–8.1)

## 2023-09-30 LAB — LIPASE, BLOOD: Lipase: 27 U/L (ref 11–51)

## 2023-09-30 LAB — CBG MONITORING, ED: Glucose-Capillary: 381 mg/dL — ABNORMAL HIGH (ref 70–99)

## 2023-09-30 MED ORDER — LACTATED RINGERS IV BOLUS
2000.0000 mL | Freq: Once | INTRAVENOUS | Status: AC
  Administered 2023-09-30: 2000 mL via INTRAVENOUS

## 2023-09-30 MED ORDER — INSULIN ASPART 100 UNIT/ML IJ SOLN
6.0000 [IU] | Freq: Once | INTRAMUSCULAR | Status: AC
  Administered 2023-09-30: 6 [IU] via SUBCUTANEOUS

## 2023-09-30 MED ORDER — TAMSULOSIN HCL 0.4 MG PO CAPS: 0.4000 mg | ORAL_CAPSULE | Freq: Every day | ORAL | 2 refills | Status: AC

## 2023-09-30 MED ORDER — IOHEXOL 350 MG/ML SOLN
75.0000 mL | Freq: Once | INTRAVENOUS | Status: AC | PRN
  Administered 2023-09-30: 75 mL via INTRAVENOUS

## 2023-09-30 NOTE — ED Provider Triage Note (Signed)
 Emergency Medicine Provider Triage Evaluation Note  Alexander Hunt , a 68 y.o. male  was evaluated in triage.  Pt complains of abd pain. Report lower abd pain x 2-3 days.  Urine stream isn't flowing adequately, endorse burning sensation when urinating.  Also report feeling constipated and haven't had a normal BM for 4 days.  LBM yesterday.  No fever, chills, n/v  Review of Systems  Positive: As above Negative: As above  Physical Exam  BP (!) 143/97   Pulse 91   Temp 97.6 F (36.4 C)   Resp 18   Ht 6' 4 (1.93 m)   Wt 77.1 kg   SpO2 98%   BMI 20.69 kg/m  Gen:   Awake, no distress   Resp:  Normal effort  MSK:   Moves extremities without difficulty  Other:    Medical Decision Making  Medically screening exam initiated at 12:04 PM.  Appropriate orders placed.  Merit Pinnock was informed that the remainder of the evaluation will be completed by another provider, this initial triage assessment does not replace that evaluation, and the importance of remaining in the ED until their evaluation is complete.     Nivia Colon, PA-C 09/30/23 1205

## 2023-09-30 NOTE — Discharge Instructions (Signed)
 You have a large prostate.  This is likely causing your urinary hesitancy.  A prescription for a medication called tamsulosin  was sent to your pharmacy.  Take this daily.  Call the telephone number below to set up a follow-up appointment with a urologist.  Take over-the-counter MiraLAX for help with constipation.  You can take multiple capfuls initially for a bowel cleanout dose.  Daily fiber supplement can help long-term to maintain daily soft stools.  Your blood sugar was elevated today.  You likely need increases in your diabetic regimen to maintain normal blood sugar levels.  Talk to your primary care doctor about this.  In the meantime, maintain a low glycemic diet.  This includes avoiding/minimizing sweet and starchy foods and drinks.  Drink plenty of water to stay hydrated.  Your test results today were otherwise reassuring.  Return to the emergency department for any new or worsening symptoms of concern.

## 2023-09-30 NOTE — ED Provider Notes (Signed)
 [place] EMERGENCY DEPARTMENT AT [facility] Provider Note   CSN: [id number] Arrival date & time: 09/30/23  1119     Patient presents with: Abdominal Pain, Constipation, and Urinary Hesitation   Alexander Hunt is a 68 y.o. male.    Abdominal Pain Associated symptoms: constipation and dysuria   Constipation Associated symptoms: abdominal pain and dysuria   Patient presents for abdominal pain.  Medical history includes HTN, HLD, diabetes.  He was seen in the ED 11 days ago for fatigue and hypertension.  His blood glucose was elevated at the time.  He was treated with ibuprofen, insulin .  His urinalysis at the time was nitrite positive.  He was prescribed Keflex .  Since that time, over the past 4 days, he has had only small bowel movements, the last of which was yesterday.  He has had hesitancy and dysuria when urinating.  He has had a lower abdominal pain for the past 2 days.  Currently, pain is minimal.  He states that he did take the antibiotics that he was prescribed a week and a half ago.  He currently takes a long-acting insulin  as well as metformin  for management of his diabetes.     Prior to Admission medications   Medication Sig Start Date End Date Taking? Authorizing Provider  tamsulosin  (FLOMAX ) 0.4 MG CAPS capsule Take 1 capsule (0.4 mg total) by mouth daily. 09/30/23 12/29/23 Yes Melvenia Motto, MD  Accu-Chek Softclix Lancets lancets Use to check blood sugar 3 times daily. 08/15/23   Newlin, Enobong, MD  amLODipine  (NORVASC ) 10 MG tablet Take 1 tablet (10 mg total) by mouth daily. 05/19/23   Fleming, Zelda W, NP  atorvastatin  (LIPITOR) 80 MG tablet Take 1 tablet (80 mg total) by mouth daily. 08/15/23   Theotis Haze ORN, NP  blood glucose meter kit and supplies KIT Use as instructed. Check blood glucose level by fingerstick twice per day. 08/15/23   Fleming, Zelda W, NP  Blood Glucose Monitoring Suppl (ACCU-CHEK GUIDE) w/Device KIT Use to check blood sugar 3 times daily.  08/15/23   Newlin, Enobong, MD  cephALEXin  (KEFLEX ) 500 MG capsule Take 1 capsule (500 mg total) by mouth 4 (four) times daily. 09/19/23   Armenta Canning, MD  chlorthalidone  (HYGROTON ) 25 MG tablet Take 1 tablet (25 mg total) by mouth daily. FOR BLOOD PRESSURE. 05/19/23   Fleming, Zelda W, NP  clotrimazole  (CLOTRIMAZOLE  ANTI-FUNGAL) 1 % cream Apply 1 Application topically 2 (two) times daily. 09/11/23   Fleming, Zelda W, NP  ferrous gluconate  (FERGON) 324 MG tablet Take 1 tablet (324 mg total) by mouth daily. 12/29/22   Fleming, Zelda W, NP  gatifloxacin  (ZYMAXID ) 0.5 % SOLN Place 1 drop into the right eye 4 (four) times daily as directed. Store supside down Patient not taking: Reported on 08/15/2023 02/28/23   Austin Olam CROME, MD  glucose blood (ACCU-CHEK GUIDE TEST) test strip Use to check blood sugar 3 times daily. 08/15/23   Newlin, Enobong, MD  insulin  glargine (LANTUS ) 100 UNIT/ML Solostar Pen Inject 10 Units into the skin daily at 10 pm. 08/15/23   Theotis Haze ORN, NP  Insulin  Pen Needle (INSUPEN PEN NEEDLES) 32G X 4 MM MISC Use to inject insulin  once daily. 09/11/23   Newlin, Enobong, MD  ketorolac  (ACULAR ) 0.5 % ophthalmic solution Place 1 drop into the right eye 4 (four) times daily as directed. Patient not taking: Reported on 08/15/2023 02/28/23   Austin Olam CROME, MD  lidocaine  (XYLOCAINE ) 2 % solution Use as  directed 15 mLs in the mouth or throat as needed for mouth pain. 08/15/23   Fleming, Zelda W, NP  metFORMIN  (GLUCOPHAGE -XR) 750 MG 24 hr tablet Take 1 tablet (750 mg total) by mouth daily with breakfast. 08/15/23   Fleming, Zelda W, NP  nystatin  cream (MYCOSTATIN ) Apply 1 Application topically 2 (two) times daily. 08/15/23   Fleming, Zelda W, NP  omeprazole  (PRILOSEC) 20 MG capsule Take 1 capsule (20 mg total) by mouth daily for heartburn. 07/30/23   Fleming, Zelda W, NP  prednisoLONE  acetate (PRED FORTE ) 1 % ophthalmic suspension Place 1 drop into the right eye 4 (four) times daily for 5 days then  stop. Patient not taking: Reported on 08/15/2023 02/28/23     tadalafil  (CIALIS ) 5 MG tablet TAKE 2-4 TABLETS (10-20 MG TOTAL) BY MOUTH DAILY AS NEEDED FOR ERECTILE DYSFUNCTION. 12/25/22   Fleming, Zelda W, NP  Vitamin D , Ergocalciferol , (DRISDOL ) 1.25 MG (50000 UNIT) CAPS capsule Take 1 capsule (50,000 Units total) by mouth every 7 (seven) days. 08/18/23   Fleming, Zelda W, NP    Allergies: Patient has no known allergies.    Review of Systems  Gastrointestinal:  Positive for abdominal pain and constipation.  Genitourinary:  Positive for difficulty urinating and dysuria.  All other systems reviewed and are negative.   Updated Vital Signs BP (!) 149/72   Pulse 74   Temp 98.2 F (36.8 C) (Oral)   Resp 17   Ht 6' 4 (1.93 m)   Wt 77.1 kg   SpO2 99%   BMI 20.69 kg/m   Physical Exam Vitals and nursing note reviewed.  Constitutional:      General: He is not in acute distress.    Appearance: He is well-developed. He is not ill-appearing, toxic-appearing or diaphoretic.  HENT:     Head: Normocephalic and atraumatic.     Mouth/Throat:     Mouth: Mucous membranes are moist.  Eyes:     Conjunctiva/sclera: Conjunctivae normal.  Cardiovascular:     Rate and Rhythm: Normal rate and regular rhythm.  Pulmonary:     Effort: Pulmonary effort is normal. No respiratory distress.  Abdominal:     General: Abdomen is flat.     Palpations: Abdomen is soft.  Musculoskeletal:        General: No swelling.     Cervical back: Neck supple.  Skin:    General: Skin is warm and dry.  Neurological:     General: No focal deficit present.     Mental Status: He is alert and oriented to person, place, and time.  Psychiatric:        Mood and Affect: Mood normal.        Behavior: Behavior normal.     (all labs ordered are listed, but only abnormal results are displayed) Labs Reviewed  CBC WITH DIFFERENTIAL/PLATELET - Abnormal; Notable for the following components:      Result Value   WBC 11.3 (*)     RBC 6.65 (*)    MCV 65.0 (*)    MCH 20.3 (*)    RDW 15.7 (*)    Neutro Abs 8.6 (*)    Abs Granulocyte 8.6 (*)    All other components within normal limits  COMPREHENSIVE METABOLIC PANEL WITH GFR - Abnormal; Notable for the following components:   Sodium 126 (*)    Chloride 86 (*)    Glucose, Bld 435 (*)    Creatinine, Ser 1.38 (*)    Total Protein 8.3 (*)  GFR, Estimated 56 (*)    All other components within normal limits  URINALYSIS, ROUTINE W REFLEX MICROSCOPIC - Abnormal; Notable for the following components:   Glucose, UA >=500 (*)    Hgb urine dipstick SMALL (*)    All other components within normal limits  CBG MONITORING, ED - Abnormal; Notable for the following components:   Glucose-Capillary 381 (*)    All other components within normal limits  LIPASE, BLOOD    EKG: None  Radiology: CT ABDOMEN PELVIS W CONTRAST Result Date: 09/30/2023 CLINICAL DATA:  Abdominal pain, acute, nonlocalized. EXAM: CT ABDOMEN AND PELVIS WITH CONTRAST TECHNIQUE: Multidetector CT imaging of the abdomen and pelvis was performed using the standard protocol following bolus administration of intravenous contrast. RADIATION DOSE REDUCTION: This exam was performed according to the departmental dose-optimization program which includes automated exposure control, adjustment of the mA and/or kV according to patient size and/or use of iterative reconstruction technique. CONTRAST:  75mL OMNIPAQUE  IOHEXOL  350 MG/ML SOLN COMPARISON:  CT scan abdomen and pelvis from 10/09/2012. FINDINGS: Lower chest: The lung bases are clear. No pleural effusion. The heart is normal in size. No pericardial effusion. Hepatobiliary: The liver is normal in size. Non-cirrhotic configuration. No suspicious mass. These is mild diffuse hepatic steatosis. No intrahepatic or extrahepatic bile duct dilation. No calcified gallstones. Normal gallbladder wall thickness. No pericholecystic inflammatory changes. Pancreas: Unremarkable. No  pancreatic ductal dilatation or surrounding inflammatory changes. Spleen: Within normal limits. No focal lesion. Adrenals/Urinary Tract: Adrenal glands are unremarkable. No suspicious renal mass. No hydronephrosis. No renal or ureteric calculi. Unremarkable urinary bladder. Stomach/Bowel: No disproportionate dilation of the small or large bowel loops. No evidence of abnormal bowel wall thickening or inflammatory changes. The appendix is unremarkable. Vascular/Lymphatic: No ascites or pneumoperitoneum. No abdominal or pelvic lymphadenopathy, by size criteria. No aneurysmal dilation of the major abdominal arteries. There are mild peripheral atherosclerotic vascular calcifications of the aorta and its major branches. Reproductive: Enlarged prostate. Symmetric seminal vesicles. Other: There are fat containing umbilical and bilateral inguinal hernias. The soft tissues and abdominal wall are otherwise unremarkable. Musculoskeletal: No suspicious osseous lesions. There are mild multilevel degenerative changes in the visualized spine. IMPRESSION: 1. No acute inflammatory process identified within the abdomen or pelvis. 2. Multiple other nonacute observations, as described above. Aortic Atherosclerosis (ICD10-I70.0). Electronically Signed   By: Ree Molt M.D.   On: 09/30/2023 14:21     Procedures   Medications Ordered in the ED  insulin  aspart (novoLOG ) injection 6 Units (has no administration in time range)  iohexol  (OMNIPAQUE ) 350 MG/ML injection 75 mL (75 mLs Intravenous Contrast Given 09/30/23 1359)  lactated ringers  bolus 2,000 mL (2,000 mLs Intravenous New Bag/Given 09/30/23 1620)                                    Medical Decision Making Risk Prescription drug management.   This patient presents to the ED for concern of multiple complaints, this involves an extensive number of treatment options, and is a complaint that carries with it a high risk of complications and morbidity.  The differential  diagnosis includes drug-induced constipation, slow transit constipation, enlarged prostate, UTI, dehydration   Co morbidities / Chronic conditions that complicate the patient evaluation  HTN, HLD, diabetes   Additional history obtained:  Additional history obtained from EMR External records from outside source obtained and reviewed including patient's significant other   Lab Tests:  I  Ordered, and personally interpreted labs.  The pertinent results include: Hyperglycemia without evidence of DKA, pseudohyponatremia, baseline creatinine, normal hemoglobin, slightly cytosis, glucosuria without evidence of UTI.   Imaging Studies ordered:  I ordered imaging studies including CT of the abdomen and pelvis I independently visualized and interpreted imaging which showed no acute findings, chronic findings of enlarged prostate; moderate stool burden I agree with the radiologist interpretation   Cardiac Monitoring: / EKG:  The patient was maintained on a cardiac monitor.  I personally viewed and interpreted the cardiac monitored which showed an underlying rhythm of: Sinus rhythm   Problem List / ED Course / Critical interventions / Medication management  Patient presenting for lower abdominal pain, as well as concerns of constipation and dysuria.  Vital signs on arrival notable for mild hypertension.  Prior to being bedded in the ED, workup was initiated.  Patient's lab work notable for mild leukocytosis.  He has hyperglycemia with pseudohyponatremia but without evidence of DKA.  Kidney function is baseline.  Urinalysis is not consistent with UTI.  CT of abdomen and pelvis does not show any acute findings.  Patient does have an enlarged prostate which likely explains his urinary hesitancy.  He is not currently taking Flomax  and does not see urology.  For his insulin , he takes a long-acting shot of Lantus  as well as a metformin  tablet daily.  On exam today he is well-appearing.  Currently, his  abdominal pain is resolved.  Given his hyperglycemia, will give IV fluids and correction dose of insulin  as needed.  Patient was prescribed Flomax  to help with his urinary hesitancy.  He was advised to follow-up with urology.  He was also advised to follow-up with PCP or long-term management of his diabetes.  Today, after fluids, CBG improved to 381.  Correction dose of insulin  was ordered.  Patient was discharged in stable condition. I ordered medication including IV fluids and insulin  for hyperglycemia Reevaluation of the patient after these medicines showed that the patient improved I have reviewed the patients home medicines and have made adjustments as needed  Social Determinants of Health:  Lives independently, has a PCP     Final diagnoses:  Hyperglycemia  Constipation, unspecified constipation type  Enlarged prostate    ED Discharge Orders          Ordered    tamsulosin  (FLOMAX ) 0.4 MG CAPS capsule  Daily        09/30/23 1612               Melvenia Motto, MD 09/30/23 1819

## 2023-09-30 NOTE — ED Triage Notes (Signed)
 Pt states he can not urinate or defecating for 4 days. C/O abd pain when urinating. Denies nausea/vomiting/CP/SHOB.

## 2023-10-01 ENCOUNTER — Inpatient Hospital Stay: Admitting: Nurse Practitioner

## 2023-10-15 ENCOUNTER — Other Ambulatory Visit: Payer: Self-pay

## 2023-10-24 ENCOUNTER — Telehealth: Payer: Self-pay | Admitting: Nurse Practitioner

## 2023-10-24 NOTE — Telephone Encounter (Signed)
 Lvm to confirm appt for 9/22

## 2023-10-26 NOTE — Progress Notes (Unsigned)
 S:     No chief complaint on file.  68 y.o. male who presents for diabetes evaluation, education, and management. PMH is significant for T2DM, HTN, HLD. Patient reports Diabetes was diagnosed in 2022. Patient was referred and last seen by Primary Care Provider, Haze Arlington, NP, on 08/15/23. A1c at that visit was 13.9 (from 6.9 prior). Patient was started on metformin  750 mg XR daily + Lantus  10 units daily. Patient saw pharmacy 09/11/23 and no medication changes were made. Encouraged patient to use glucometer to measure BG readings at home.  Since last visit, patient has been hospitalized twice for hyperglycemia. Hospitalization on 8/15, patient had BG 509 with no evidence of DKA. An incidental urinalysis was obtained that was positive for nitrates. Keflex  was prescribed for patient. Patient was hospitalized again on 8/26. Glucose was elevated at 435 with no evidence of DKA. A correction does of insulin  was given and glucose improved to 381. Patient was discharged with no medication changes.  Patient arrives in *** good spirits and presents without *** any assistance. ***Patient is accompanied by ***.  BG readings at home? Hypo? Hyper? Increase lantus  and possibly add meal time insulin    Family/Social History: ***  Current diabetes medications include: Lantus  10 units at night, metformin  XR 750 mg with breakfast Current hypertension medications include: amlodipine  10 mg, chlorthalidone  25 mg Current hyperlipidemia medications include: atorvastatin  80 mg  Patient reports adherence to taking all medications as prescribed.  *** Patient denies adherence with medications, reports missing *** medications *** times per week, on average.  Do you feel that your medications are working for you? {YES NO:22349} Have you been experiencing any side effects to the medications prescribed? {YES NO:22349} Do you have any problems obtaining medications due to transportation or finances? {YES  [id number] Insurance coverage: ***  Patient {Actions; denies-reports:120008} hypoglycemic events.  Reported home fasting blood sugars: ***  Reported 2 hour post-meal/random blood sugars: ***.  Patient {Actions; denies-reports:120008} nocturia (nighttime urination).  Patient {Actions; denies-reports:120008} neuropathy (nerve pain). Patient {Actions; denies-reports:120008} visual changes. Patient {Actions; denies-reports:120008} self foot exams.   Patient reported dietary habits: Eats *** meals/day Breakfast: *** Lunch: *** Dinner: *** Snacks: *** Drinks: ***  Within the past 12 months, did you worry whether your food would run out before you got money to buy more? {YES NO:22349} Within the past 12 months, did the food you bought run out, and you didn't have money to get more? {YES NO:22349} PHQ-9 Score: ***  Patient-reported exercise habits: ***  O:   ROS  Physical Exam  7 day average blood glucose: ***  Libre3 CGM Download today *** % Time CGM is active: ***% Average Glucose: *** mg/dL Glucose Management Indicator: ***  Glucose Variability: ***% (goal <36%) Time in Goal:  - Time in range 70-180: ***% - Time above range: ***% - Time below range: ***% Observed patterns:  Lab Results  Component Value Date   HGBA1C 13.9 (A) 08/15/2023   There were no vitals filed for this visit.  Lipid Panel     Component Value Date/Time   CHOL 153 12/25/2022 1532   TRIG 211 (H) 12/25/2022 1532   HDL 34 (L) 12/25/2022 1532   CHOLHDL 4.5 12/25/2022 1532   CHOLHDL 5.3 (H) 03/13/2016 1105   VLDL 25 03/13/2016 1105   LDLCALC 83 12/25/2022 1532    Clinical Atherosclerotic Cardiovascular Disease (ASCVD): {YES/NO:21197} The 10-year ASCVD risk score (Arnett DK, et al., 2019) is: 56.6%   Values used to calculate  the score:     Age: 41 years     Clincally relevant sex: Male     Is Non-Hispanic African American: Yes     Diabetic: Yes     Tobacco smoker: Yes     Systolic Blood  Pressure: 149 mmHg     Is BP treated: Yes     HDL Cholesterol: 34 mg/dL     Total Cholesterol: 153 mg/dL   Patient is participating in a Managed Medicaid Plan:  {MM YES/NO:27447::Yes}   A/P: Diabetes longstanding *** currently ***. Patient is *** able to verbalize appropriate hypoglycemia management plan. Medication adherence appears ***. Control is suboptimal due to ***. -{Meds adjust:18428} basal insulin  *** Lantus /Basaglar /Semglee  (insulin  glargine) *** Tresiba (insulin  degludec) from *** units to *** units daily in the morning. Patient will continue to titrate 1 unit every *** days if fasting blood sugar > 100mg /dl until fasting blood sugars reach goal or next visit.  -{Meds adjust:18428} rapid insulin  *** Novolog  (insulin  aspart) *** Humalog (insulin  lispro) from *** to ***.  -{Meds adjust:18428} GLP-1 *** Trulicity (dulaglutide) *** Ozempic (semaglutide) *** Mounjaro (tirzepatide) from *** mg to *** mg .  -{Meds adjust:18428} SGLT2-I *** Farxiga (dapagliflozin) *** Jardiance (empagliflozin) 10 mg. Counseled on sick day rules. -{Meds adjust:18428} metformin  ***.  -Patient educated on purpose, proper use, and potential adverse effects of ***.  -Extensively discussed pathophysiology of diabetes, recommended lifestyle interventions, dietary effects on blood sugar control.  -Counseled on s/sx of and management of hypoglycemia.  -Next A1c anticipated ***.   ASCVD risk - primary ***secondary prevention in patient with diabetes. Last LDL is *** not at goal of <29 *** mg/dL. ASCVD risk factors include *** and 10-year ASCVD risk score of ***. {Desc; low/moderate/high:110033} intensity statin indicated.  -{Meds adjust:18428} ***statin *** mg.   Hypertension longstanding *** currently ***. Blood pressure goal of <130/80 *** mmHg. Medication adherence ***. Blood pressure control is suboptimal due to ***. -{Meds adjust:18428} *** mg.  Written patient instructions provided. Patient verbalized  understanding of treatment plan.  Total time in face to face counseling *** minutes.    Follow-up:  Pharmacist *** PCP clinic visit in *** Patient seen with ***

## 2023-10-27 ENCOUNTER — Ambulatory Visit: Payer: Self-pay | Admitting: Pharmacist

## 2023-11-14 ENCOUNTER — Other Ambulatory Visit: Payer: Self-pay

## 2023-11-17 ENCOUNTER — Ambulatory Visit: Attending: Nurse Practitioner | Admitting: Nurse Practitioner

## 2023-11-17 ENCOUNTER — Encounter: Payer: Self-pay | Admitting: Nurse Practitioner

## 2023-11-17 VITALS — BP 114/75 | HR 88 | Resp 18 | Ht 76.0 in | Wt 213.4 lb

## 2023-11-17 DIAGNOSIS — E1165 Type 2 diabetes mellitus with hyperglycemia: Secondary | ICD-10-CM

## 2023-11-17 LAB — POCT GLYCOSYLATED HEMOGLOBIN (HGB A1C): HbA1c POC (<> result, manual entry): >15 % (ref 4.0–5.6)

## 2023-11-17 NOTE — Progress Notes (Signed)
 Assessment & Plan:  Diagnoses and all orders for this visit:  Type 2 diabetes mellitus with hyperglycemia, without long-term current use of insulin  (HCC) -     POCT glycosylated hemoglobin (Hb A1C)    Patient has been counseled on age-appropriate routine health concerns for screening and prevention. These are reviewed and up-to-date. Referrals have been placed accordingly. Immunizations are up-to-date or declined.    Subjective:  No chief complaint on file.      Lab Results  Component Value Date   HGBA1C 13.9 (A) 08/15/2023    Lab Results  Component Value Date   HGBA1C 13.9 (A) 08/15/2023     ROS  Past Medical History:  Diagnosis Date   Hyperlipidemia    Hypertension 03/13/2016   Prediabetes     Past Surgical History:  Procedure Laterality Date   NO PAST SURGERIES      Family History  Problem Relation Age of Onset   Diabetes Maternal Grandmother    Hypertension Maternal Grandmother    Colon cancer Neg Hx    Colon polyps Neg Hx    Rectal cancer Neg Hx    Stomach cancer Neg Hx    Esophageal cancer Neg Hx     Social History Reviewed with no changes to be made today.   Outpatient Medications Prior to Visit  Medication Sig Dispense Refill   Accu-Chek Softclix Lancets lancets Use to check blood sugar 3 times daily. 100 each 6   amLODipine  (NORVASC ) 10 MG tablet Take 1 tablet (10 mg total) by mouth daily. 90 tablet 1   atorvastatin  (LIPITOR) 80 MG tablet Take 1 tablet (80 mg total) by mouth daily. 90 tablet 2   blood glucose meter kit and supplies KIT Use as instructed. Check blood glucose level by fingerstick twice per day. 1 each 0   Blood Glucose Monitoring Suppl (ACCU-CHEK GUIDE) w/Device KIT Use to check blood sugar 3 times daily. 1 kit 0   chlorthalidone  (HYGROTON ) 25 MG tablet Take 1 tablet (25 mg total) by mouth daily. FOR BLOOD PRESSURE. 90 tablet 1   clotrimazole  (CLOTRIMAZOLE  ANTI-FUNGAL) 1 % cream Apply 1 Application topically 2 (two) times  daily. 60 g 0   glucose blood (ACCU-CHEK GUIDE TEST) test strip Use to check blood sugar 3 times daily. 100 each 6   insulin  glargine (LANTUS ) 100 UNIT/ML Solostar Pen Inject 10 Units into the skin daily at 10 pm. 3 mL 6   Insulin  Pen Needle (INSUPEN PEN NEEDLES) 32G X 4 MM MISC Use to inject insulin  once daily. 100 each 1   lidocaine  (XYLOCAINE ) 2 % solution Use as directed 15 mLs in the mouth or throat as needed for mouth pain. 100 mL 1   nystatin  cream (MYCOSTATIN ) Apply 1 Application topically 2 (two) times daily. 60 g 1   omeprazole  (PRILOSEC) 20 MG capsule Take 1 capsule (20 mg total) by mouth daily for heartburn. 90 capsule 1   tadalafil  (CIALIS ) 5 MG tablet TAKE 2-4 TABLETS (10-20 MG TOTAL) BY MOUTH DAILY AS NEEDED FOR ERECTILE DYSFUNCTION. 20 tablet 6   tamsulosin  (FLOMAX ) 0.4 MG CAPS capsule Take 1 capsule (0.4 mg total) by mouth daily. 30 capsule 2   cephALEXin  (KEFLEX ) 500 MG capsule Take 1 capsule (500 mg total) by mouth 4 (four) times daily. (Patient not taking: Reported on 11/17/2023) 28 capsule 0   ferrous gluconate  (FERGON) 324 MG tablet Take 1 tablet (324 mg total) by mouth daily. (Patient not taking: Reported on 11/17/2023) 90 tablet 1  gatifloxacin  (ZYMAXID ) 0.5 % SOLN Place 1 drop into the right eye 4 (four) times daily as directed. Store supside down (Patient not taking: Reported on 11/17/2023) 5 mL 1   ketorolac  (ACULAR ) 0.5 % ophthalmic solution Place 1 drop into the right eye 4 (four) times daily as directed. (Patient not taking: Reported on 11/17/2023) 5 mL 1   metFORMIN  (GLUCOPHAGE -XR) 750 MG 24 hr tablet Take 1 tablet (750 mg total) by mouth daily with breakfast. (Patient not taking: Reported on 11/17/2023) 90 tablet 1   prednisoLONE  acetate (PRED FORTE ) 1 % ophthalmic suspension Place 1 drop into the right eye 4 (four) times daily for 5 days then stop. (Patient not taking: Reported on 11/17/2023) 5 mL 1   Vitamin D , Ergocalciferol , (DRISDOL ) 1.25 MG (50000 UNIT) CAPS  capsule Take 1 capsule (50,000 Units total) by mouth every 7 (seven) days. (Patient not taking: Reported on 11/17/2023) 12 capsule 1   Facility-Administered Medications Prior to Visit  Medication Dose Route Frequency Provider Last Rate Last Admin   0.9 %  sodium chloride  infusion  500 mL Intravenous Continuous Avram Lupita BRAVO, MD        No Known Allergies     Objective:    BP 114/75 (BP Location: Left Arm, Patient Position: Sitting, Cuff Size: Normal)   Pulse 88   Resp 18   Ht 6' 4 (1.93 m)   Wt 213 lb 6.4 oz (96.8 kg)   SpO2 98%   BMI 25.98 kg/m  Wt Readings from Last 3 Encounters:  11/17/23 213 lb 6.4 oz (96.8 kg)  09/30/23 170 lb (77.1 kg)  09/19/23 180 lb (81.6 kg)    Physical Exam       Patient has been counseled extensively about nutrition and exercise as well as the importance of adherence with medications and regular follow-up. The patient was given clear instructions to go to ER or return to medical center if symptoms don't improve, worsen or new problems develop. The patient verbalized understanding.   Follow-up: No follow-ups on file.   Haze LELON Servant, FNP-BC [facility] [place], [place] [phone]   11/17/2023, 4:33 PM

## 2023-11-19 ENCOUNTER — Ambulatory Visit: Admitting: Nurse Practitioner

## 2023-11-19 ENCOUNTER — Other Ambulatory Visit: Payer: Self-pay

## 2023-11-20 ENCOUNTER — Other Ambulatory Visit: Payer: Self-pay

## 2023-11-20 ENCOUNTER — Telehealth: Payer: Self-pay | Admitting: Nurse Practitioner

## 2023-11-20 NOTE — Telephone Encounter (Signed)
 Copied from CRM #[id number]. Topic: Appointments - Scheduling Inquiry for Clinic >> Nov 19, 2023  3:29 PM Alexander Hunt wrote:  Reason for CRM: The patient would like to be contacted to discuss their options for scheduling an office visit with their PCP for medication review. Please contact further when available

## 2023-11-20 NOTE — Telephone Encounter (Addendum)
 Hi Priscilla,   This patient was double-booked on 10/15 at 4;10 but canceled due to work conflict. They stated they really need to see the PCP, Is there any way we can double book him for tomorrow or sometime next week?

## 2023-11-22 NOTE — Telephone Encounter (Signed)
 Double book 130 or schedule him for 350 slot

## 2023-11-24 ENCOUNTER — Ambulatory Visit: Attending: Nurse Practitioner | Admitting: Nurse Practitioner

## 2023-11-24 ENCOUNTER — Other Ambulatory Visit: Payer: Self-pay

## 2023-11-24 ENCOUNTER — Ambulatory Visit: Admitting: Family Medicine

## 2023-11-24 ENCOUNTER — Encounter: Payer: Self-pay | Admitting: Nurse Practitioner

## 2023-11-24 VITALS — BP 128/82 | HR 77 | Resp 19 | Ht 76.0 in | Wt 213.4 lb

## 2023-11-24 DIAGNOSIS — N529 Male erectile dysfunction, unspecified: Secondary | ICD-10-CM | POA: Diagnosis not present

## 2023-11-24 DIAGNOSIS — E119 Type 2 diabetes mellitus without complications: Secondary | ICD-10-CM

## 2023-11-24 DIAGNOSIS — Z794 Long term (current) use of insulin: Secondary | ICD-10-CM | POA: Diagnosis not present

## 2023-11-24 DIAGNOSIS — D509 Iron deficiency anemia, unspecified: Secondary | ICD-10-CM

## 2023-11-24 DIAGNOSIS — Z7984 Long term (current) use of oral hypoglycemic drugs: Secondary | ICD-10-CM | POA: Diagnosis not present

## 2023-11-24 DIAGNOSIS — E559 Vitamin D deficiency, unspecified: Secondary | ICD-10-CM

## 2023-11-24 DIAGNOSIS — D508 Other iron deficiency anemias: Secondary | ICD-10-CM | POA: Diagnosis not present

## 2023-11-24 DIAGNOSIS — E1165 Type 2 diabetes mellitus with hyperglycemia: Secondary | ICD-10-CM | POA: Diagnosis not present

## 2023-11-24 LAB — GLUCOSE, POCT (MANUAL RESULT ENTRY): POC Glucose: 472 mg/dL — AB (ref 70–99)

## 2023-11-24 MED ORDER — INSULIN GLARGINE 100 UNIT/ML SOLOSTAR PEN
25.0000 [IU] | PEN_INJECTOR | Freq: Every day | SUBCUTANEOUS | 1 refills | Status: AC
  Filled 2023-11-24: qty 15, 60d supply, fill #0
  Filled 2023-12-01: qty 21, 84d supply, fill #0
  Filled 2024-03-11: qty 9, 36d supply, fill #1

## 2023-11-24 MED ORDER — TADALAFIL 5 MG PO TABS
10.0000 mg | ORAL_TABLET | Freq: Every day | ORAL | 6 refills | Status: AC | PRN
  Filled 2023-11-24: qty 20, 5d supply, fill #0
  Filled 2023-12-19 (×2): qty 20, 5d supply, fill #1

## 2023-11-24 MED ORDER — INSULIN ASPART 100 UNIT/ML IJ SOLN
10.0000 [IU] | Freq: Once | INTRAMUSCULAR | Status: AC
  Administered 2023-11-24: 10 [IU] via SUBCUTANEOUS

## 2023-11-24 MED ORDER — VITAMIN D (ERGOCALCIFEROL) 1.25 MG (50000 UNIT) PO CAPS
50000.0000 [IU] | ORAL_CAPSULE | ORAL | 1 refills | Status: AC
  Filled 2023-11-24: qty 12, 84d supply, fill #0
  Filled 2024-02-09: qty 12, 84d supply, fill #1

## 2023-11-24 NOTE — Telephone Encounter (Signed)
 Noted

## 2023-11-24 NOTE — Progress Notes (Signed)
 Assessment & Plan:  Alexander Hunt was seen today for diabetes.  Diagnoses and all orders for this visit:  Diabetes mellitus treated with insulin  and oral medication  Dose change: Increase Lantus  to 25 units.  If blood glucose readings remain elevated we will need to increase Lantus  to 30 units next week. -     POCT glucose (manual entry) -     CMP14+EGFR -     insulin  aspart (novoLOG ) injection 10 Units -     insulin  glargine (LANTUS ) 100 UNIT/ML Solostar Pen; Inject 25 Units into the skin daily at 10 pm.  Erectile dysfunction, unspecified erectile dysfunction type -     tadalafil  (CIALIS ) 5 MG tablet; TAKE 2-4 TABLETS (10-20 MG TOTAL) BY MOUTH DAILY AS NEEDED FOR ERECTILE DYSFUNCTION.  Other iron  deficiency anemia -     CBC with Differential/Platelet  Vitamin D  deficiency disease -     Vitamin D , Ergocalciferol , (DRISDOL ) 1.25 MG (50000 UNIT) CAPS capsule; Take 1 capsule (50,000 Units total) by mouth every 7 (seven) days.    Patient has been counseled on age-appropriate routine health concerns for screening and prevention. These are reviewed and up-to-date. Referrals have been placed accordingly. Immunizations are up-to-date or declined.    Subjective:   Chief Complaint  Patient presents with   Diabetes    Alexander Hunt 68 y.o. male presents to office today for follow-up to diabetes type 2.  At his last visit with me a few weeks ago he did not have any of his medications or his meter for that appointment.  As he was not prepared we decided to forego that appointment and reschedule.  Today he has all of his medications with him as well as his meter.  I did teach back meter instruction with him today and he was able to self staying, provide a blood sample and read his meter reading which was 472 this morning fasting.  10 units of NovoLog  was administered here in office.  He states that he administered Lantus  16 units this morning.  I have instructed him to give himself an additional  10 units of Lantus  this evening.  He will also start with 25 units of Lantus  every day starting tomorrow. Most recent A1c is greater than 15 Lab Results  Component Value Date   HGBA1C >15 11/17/2023    He has severe vitamin D  deficiency and has not been taking weekly vitamin D .     Review of Systems  Constitutional:  Negative for fever, malaise/fatigue and weight loss.  HENT: Negative.  Negative for nosebleeds.   Eyes:  Positive for blurred vision. Negative for double vision and photophobia.  Respiratory: Negative.  Negative for cough and shortness of breath.   Cardiovascular: Negative.  Negative for chest pain, palpitations and leg swelling.  Gastrointestinal: Negative.  Negative for heartburn, nausea and vomiting.  Genitourinary:  Positive for urgency (Taking tamsulosin  as prescribed).  Musculoskeletal: Negative.  Negative for myalgias.  Neurological: Negative.  Negative for dizziness, focal weakness, seizures and headaches.  Psychiatric/Behavioral: Negative.  Negative for suicidal ideas.     Past Medical History:  Diagnosis Date   Hyperlipidemia    Hypertension 03/13/2016   Prediabetes     Past Surgical History:  Procedure Laterality Date   NO PAST SURGERIES      Family History  Problem Relation Age of Onset   Diabetes Maternal Grandmother    Hypertension Maternal Grandmother    Colon cancer Neg Hx    Colon polyps Neg Hx  Rectal cancer Neg Hx    Stomach cancer Neg Hx    Esophageal cancer Neg Hx     Social History Reviewed with no changes to be made today.   Outpatient Medications Prior to Visit  Medication Sig Dispense Refill   Accu-Chek Softclix Lancets lancets Use to check blood sugar 3 times daily. 100 each 6   amLODipine  (NORVASC ) 10 MG tablet Take 1 tablet (10 mg total) by mouth daily. 90 tablet 1   atorvastatin  (LIPITOR) 80 MG tablet Take 1 tablet (80 mg total) by mouth daily. 90 tablet 2   blood glucose meter kit and supplies KIT Use as instructed.  Check blood glucose level by fingerstick twice per day. 1 each 0   Blood Glucose Monitoring Suppl (ACCU-CHEK GUIDE) w/Device KIT Use to check blood sugar 3 times daily. 1 kit 0   chlorthalidone  (HYGROTON ) 25 MG tablet Take 1 tablet (25 mg total) by mouth daily. FOR BLOOD PRESSURE. 90 tablet 1   clotrimazole  (CLOTRIMAZOLE  ANTI-FUNGAL) 1 % cream Apply 1 Application topically 2 (two) times daily. 60 g 0   glucose blood (ACCU-CHEK GUIDE TEST) test strip Use to check blood sugar 3 times daily. 100 each 6   Insulin  Pen Needle (INSUPEN PEN NEEDLES) 32G X 4 MM MISC Use to inject insulin  once daily. 100 each 1   metFORMIN  (GLUCOPHAGE -XR) 750 MG 24 hr tablet Take 1 tablet (750 mg total) by mouth daily with breakfast. 90 tablet 1   omeprazole  (PRILOSEC) 20 MG capsule Take 1 capsule (20 mg total) by mouth daily for heartburn. 90 capsule 1   tamsulosin  (FLOMAX ) 0.4 MG CAPS capsule Take 1 capsule (0.4 mg total) by mouth daily. 30 capsule 2   insulin  glargine (LANTUS ) 100 UNIT/ML Solostar Pen Inject 10 Units into the skin daily at 10 pm. 3 mL 6   Vitamin D , Ergocalciferol , (DRISDOL ) 1.25 MG (50000 UNIT) CAPS capsule Take 1 capsule (50,000 Units total) by mouth every 7 (seven) days. 12 capsule 1   ferrous gluconate  (FERGON) 324 MG tablet Take 1 tablet (324 mg total) by mouth daily. (Patient not taking: Reported on 11/24/2023) 90 tablet 1   gatifloxacin  (ZYMAXID ) 0.5 % SOLN Place 1 drop into the right eye 4 (four) times daily as directed. Store supside down (Patient not taking: Reported on 11/24/2023) 5 mL 1   ketorolac  (ACULAR ) 0.5 % ophthalmic solution Place 1 drop into the right eye 4 (four) times daily as directed. (Patient not taking: Reported on 11/24/2023) 5 mL 1   lidocaine  (XYLOCAINE ) 2 % solution Use as directed 15 mLs in the mouth or throat as needed for mouth pain. (Patient not taking: Reported on 11/24/2023) 100 mL 1   nystatin  cream (MYCOSTATIN ) Apply 1 Application topically 2 (two) times daily. (Patient  not taking: Reported on 11/24/2023) 60 g 1   prednisoLONE  acetate (PRED FORTE ) 1 % ophthalmic suspension Place 1 drop into the right eye 4 (four) times daily for 5 days then stop. (Patient not taking: Reported on 11/24/2023) 5 mL 1   tadalafil  (CIALIS ) 5 MG tablet TAKE 2-4 TABLETS (10-20 MG TOTAL) BY MOUTH DAILY AS NEEDED FOR ERECTILE DYSFUNCTION. 20 tablet 6   Facility-Administered Medications Prior to Visit  Medication Dose Route Frequency Provider Last Rate Last Admin   0.9 %  sodium chloride  infusion  500 mL Intravenous Continuous Avram Lupita BRAVO, MD        No Known Allergies     Objective:    BP 128/82 (BP Location: Left Arm,  Patient Position: Sitting, Cuff Size: Normal)   Pulse 77   Resp 19   Ht 6' 4 (1.93 m)   Wt 213 lb 6.4 oz (96.8 kg)   SpO2 98%   BMI 25.98 kg/m  Wt Readings from Last 3 Encounters:  11/24/23 213 lb 6.4 oz (96.8 kg)  11/17/23 213 lb 6.4 oz (96.8 kg)  09/30/23 170 lb (77.1 kg)    Physical Exam Vitals and nursing note reviewed.  Constitutional:      Appearance: He is well-developed.  HENT:     Head: Normocephalic and atraumatic.  Cardiovascular:     Rate and Rhythm: Normal rate and regular rhythm.     Heart sounds: Normal heart sounds. No murmur heard.    No friction rub. No gallop.  Pulmonary:     Effort: Pulmonary effort is normal. No tachypnea or respiratory distress.     Breath sounds: Normal breath sounds. No decreased breath sounds, wheezing, rhonchi or rales.  Chest:     Chest wall: No tenderness.  Musculoskeletal:        General: Normal range of motion.     Cervical back: Normal range of motion.  Skin:    General: Skin is warm and dry.  Neurological:     Mental Status: He is alert and oriented to person, place, and time.     Coordination: Coordination normal.  Psychiatric:        Behavior: Behavior normal. Behavior is cooperative.        Thought Content: Thought content normal.        Judgment: Judgment normal.           Patient has been counseled extensively about nutrition and exercise as well as the importance of adherence with medications and regular follow-up. The patient was given clear instructions to go to ER or return to medical center if symptoms don't improve, worsen or new problems develop. The patient verbalized understanding.   Follow-up: Return in about 3 months (around 02/24/2024).   Haze LELON Servant, FNP-BC [facility] and [facility] [place], [place] [phone]   11/24/2023, 9:40 AM

## 2023-11-25 LAB — CBC WITH DIFFERENTIAL/PLATELET
Basophils Absolute: 0.1 x10E3/uL (ref 0.0–0.2)
Basos: 1 %
EOS (ABSOLUTE): 0.1 x10E3/uL (ref 0.0–0.4)
Eos: 1 %
Hematocrit: 44.5 % (ref 37.5–51.0)
Hemoglobin: 13.9 g/dL (ref 13.0–17.7)
Immature Grans (Abs): 0 x10E3/uL (ref 0.0–0.1)
Immature Granulocytes: 0 %
Lymphocytes Absolute: 1.6 x10E3/uL (ref 0.7–3.1)
Lymphs: 24 %
MCH: 20.8 pg — ABNORMAL LOW (ref 26.6–33.0)
MCHC: 31.2 g/dL — ABNORMAL LOW (ref 31.5–35.7)
MCV: 67 fL — ABNORMAL LOW (ref 79–97)
Monocytes Absolute: 0.4 x10E3/uL (ref 0.1–0.9)
Monocytes: 5 %
Neutrophils Absolute: 4.6 x10E3/uL (ref 1.4–7.0)
Neutrophils: 69 %
Platelets: 216 x10E3/uL (ref 150–450)
RBC: 6.68 x10E6/uL — ABNORMAL HIGH (ref 4.14–5.80)
RDW: 17.7 % — ABNORMAL HIGH (ref 11.6–15.4)
WBC: 6.7 x10E3/uL (ref 3.4–10.8)

## 2023-11-25 LAB — CMP14+EGFR
ALT: 34 IU/L (ref 0–44)
AST: 24 IU/L (ref 0–40)
Albumin: 4.8 g/dL (ref 3.9–4.9)
Alkaline Phosphatase: 136 IU/L — ABNORMAL HIGH (ref 47–123)
BUN/Creatinine Ratio: 12 (ref 10–24)
BUN: 15 mg/dL (ref 8–27)
Bilirubin Total: 0.7 mg/dL (ref 0.0–1.2)
CO2: 25 mmol/L (ref 20–29)
Calcium: 10.3 mg/dL — ABNORMAL HIGH (ref 8.6–10.2)
Chloride: 89 mmol/L — ABNORMAL LOW (ref 96–106)
Creatinine, Ser: 1.29 mg/dL — ABNORMAL HIGH (ref 0.76–1.27)
Globulin, Total: 2.9 g/dL (ref 1.5–4.5)
Glucose: 462 mg/dL — ABNORMAL HIGH (ref 70–99)
Potassium: 3.7 mmol/L (ref 3.5–5.2)
Sodium: 131 mmol/L — ABNORMAL LOW (ref 134–144)
Total Protein: 7.7 g/dL (ref 6.0–8.5)
eGFR: 61 mL/min/1.73 (ref >59)

## 2023-11-26 ENCOUNTER — Ambulatory Visit: Payer: Self-pay | Admitting: Nurse Practitioner

## 2023-12-01 ENCOUNTER — Other Ambulatory Visit: Payer: Self-pay

## 2023-12-02 DIAGNOSIS — N5201 Erectile dysfunction due to arterial insufficiency: Secondary | ICD-10-CM | POA: Diagnosis not present

## 2023-12-02 DIAGNOSIS — R3121 Asymptomatic microscopic hematuria: Secondary | ICD-10-CM | POA: Diagnosis not present

## 2023-12-02 DIAGNOSIS — Z125 Encounter for screening for malignant neoplasm of prostate: Secondary | ICD-10-CM | POA: Diagnosis not present

## 2023-12-02 DIAGNOSIS — N401 Enlarged prostate with lower urinary tract symptoms: Secondary | ICD-10-CM | POA: Diagnosis not present

## 2023-12-02 DIAGNOSIS — R32 Unspecified urinary incontinence: Secondary | ICD-10-CM | POA: Diagnosis not present

## 2023-12-02 DIAGNOSIS — R399 Unspecified symptoms and signs involving the genitourinary system: Secondary | ICD-10-CM | POA: Diagnosis not present

## 2023-12-04 ENCOUNTER — Encounter: Payer: Self-pay | Admitting: Urology

## 2023-12-04 ENCOUNTER — Other Ambulatory Visit: Payer: Self-pay | Admitting: Urology

## 2023-12-04 DIAGNOSIS — R972 Elevated prostate specific antigen [PSA]: Secondary | ICD-10-CM

## 2023-12-04 DIAGNOSIS — N281 Cyst of kidney, acquired: Secondary | ICD-10-CM

## 2023-12-09 DIAGNOSIS — R399 Unspecified symptoms and signs involving the genitourinary system: Secondary | ICD-10-CM | POA: Diagnosis not present

## 2023-12-19 ENCOUNTER — Other Ambulatory Visit: Payer: Self-pay

## 2023-12-22 ENCOUNTER — Other Ambulatory Visit (HOSPITAL_BASED_OUTPATIENT_CLINIC_OR_DEPARTMENT_OTHER): Payer: Self-pay

## 2024-01-14 ENCOUNTER — Other Ambulatory Visit: Payer: Self-pay

## 2024-01-15 ENCOUNTER — Other Ambulatory Visit: Payer: Self-pay

## 2024-01-18 ENCOUNTER — Ambulatory Visit
Admission: RE | Admit: 2024-01-18 | Discharge: 2024-01-18 | Disposition: A | Discharge status: Home / Self Care | Source: Ambulatory Visit | Attending: Urology | Admitting: Urology

## 2024-01-18 DIAGNOSIS — R972 Elevated prostate specific antigen [PSA]: Secondary | ICD-10-CM

## 2024-01-18 MED ORDER — GADOPICLENOL 0.5 MMOL/ML IV SOLN
10.0000 mL | Freq: Once | INTRAVENOUS | Status: AC | PRN
  Administered 2024-01-18: 10 mL via INTRAVENOUS

## 2024-02-09 ENCOUNTER — Other Ambulatory Visit: Payer: Self-pay

## 2024-02-18 ENCOUNTER — Other Ambulatory Visit: Payer: Self-pay

## 2024-02-23 ENCOUNTER — Telehealth: Payer: Self-pay | Admitting: Nurse Practitioner

## 2024-02-23 NOTE — Telephone Encounter (Signed)
 Contacted pt left vm to confirmed appt

## 2024-02-24 ENCOUNTER — Ambulatory Visit: Admitting: Nurse Practitioner

## 2024-03-11 ENCOUNTER — Other Ambulatory Visit: Payer: Self-pay

## 2024-03-12 NOTE — Progress Notes (Signed)
 Estus Krakowski                                          MRN: [id number]   03/12/2024   The [facility] Quality Team Specialist reviewed this patient medical record for the purposes of chart review for care gap closure. The following were reviewed: chart review for care gap closure-glycemic status assessment and kidney health evaluation for diabetes:eGFR  and uACR.    [facility] Quality Team

## 2024-04-02 ENCOUNTER — Ambulatory Visit: Admitting: Nurse Practitioner

## 2024-04-13 ENCOUNTER — Ambulatory Visit: Payer: Self-pay
